# Patient Record
Sex: Male | Born: 1965
Health system: Southern US, Community
[De-identification: ages and names within clinical notes are randomized; demographics above are authoritative.]

## PROBLEM LIST (undated history)

## (undated) DIAGNOSIS — E785 Hyperlipidemia, unspecified: Secondary | ICD-10-CM

## (undated) DIAGNOSIS — I10 Essential (primary) hypertension: Secondary | ICD-10-CM

## (undated) DIAGNOSIS — E119 Type 2 diabetes mellitus without complications: Secondary | ICD-10-CM

---

## 1999-06-08 ENCOUNTER — Emergency Department (HOSPITAL_COMMUNITY): Admission: EM | Admit: 1999-06-08 | Discharge: 1999-06-08 | Payer: Self-pay | Admitting: Emergency Medicine

## 2000-01-24 ENCOUNTER — Emergency Department (HOSPITAL_COMMUNITY): Admission: EM | Admit: 2000-01-24 | Discharge: 2000-01-24 | Payer: Self-pay | Admitting: Emergency Medicine

## 2000-01-24 ENCOUNTER — Encounter: Payer: Self-pay | Admitting: Emergency Medicine

## 2003-04-13 ENCOUNTER — Emergency Department (HOSPITAL_COMMUNITY): Admission: EM | Admit: 2003-04-13 | Discharge: 2003-04-13 | Payer: Self-pay | Admitting: Emergency Medicine

## 2004-02-17 ENCOUNTER — Ambulatory Visit (HOSPITAL_COMMUNITY): Admission: RE | Admit: 2004-02-17 | Discharge: 2004-02-17 | Payer: Self-pay | Admitting: Pulmonary Disease

## 2004-02-18 ENCOUNTER — Ambulatory Visit (HOSPITAL_COMMUNITY): Admission: RE | Admit: 2004-02-18 | Discharge: 2004-02-18 | Payer: Self-pay | Admitting: Pulmonary Disease

## 2005-08-16 ENCOUNTER — Emergency Department (HOSPITAL_COMMUNITY): Admission: EM | Admit: 2005-08-16 | Discharge: 2005-08-16 | Payer: Self-pay | Admitting: Emergency Medicine

## 2006-07-18 ENCOUNTER — Emergency Department (HOSPITAL_COMMUNITY): Admission: EM | Admit: 2006-07-18 | Discharge: 2006-07-18 | Payer: Self-pay | Admitting: Emergency Medicine

## 2009-10-07 ENCOUNTER — Emergency Department (HOSPITAL_COMMUNITY): Admission: EM | Admit: 2009-10-07 | Discharge: 2009-10-07 | Payer: Self-pay | Admitting: Emergency Medicine

## 2009-10-08 ENCOUNTER — Encounter: Payer: Self-pay | Admitting: Pulmonary Disease

## 2009-10-08 ENCOUNTER — Emergency Department (HOSPITAL_COMMUNITY): Admission: EM | Admit: 2009-10-08 | Discharge: 2009-10-08 | Payer: Self-pay | Admitting: Emergency Medicine

## 2009-10-23 ENCOUNTER — Ambulatory Visit (HOSPITAL_COMMUNITY): Admission: RE | Admit: 2009-10-23 | Discharge: 2009-10-23 | Payer: Self-pay | Admitting: Pulmonary Disease

## 2009-10-24 ENCOUNTER — Ambulatory Visit: Payer: Self-pay | Admitting: Pulmonary Disease

## 2009-10-24 DIAGNOSIS — E785 Hyperlipidemia, unspecified: Secondary | ICD-10-CM | POA: Insufficient documentation

## 2009-10-24 DIAGNOSIS — J189 Pneumonia, unspecified organism: Secondary | ICD-10-CM | POA: Insufficient documentation

## 2009-10-24 DIAGNOSIS — R930 Abnormal findings on diagnostic imaging of skull and head, not elsewhere classified: Secondary | ICD-10-CM | POA: Insufficient documentation

## 2009-10-24 DIAGNOSIS — I10 Essential (primary) hypertension: Secondary | ICD-10-CM | POA: Insufficient documentation

## 2009-11-14 ENCOUNTER — Encounter: Admission: RE | Admit: 2009-11-14 | Discharge: 2009-11-14 | Payer: Self-pay | Admitting: Pulmonary Disease

## 2010-01-05 ENCOUNTER — Emergency Department (HOSPITAL_COMMUNITY): Admission: EM | Admit: 2010-01-05 | Discharge: 2010-01-05 | Payer: Self-pay | Admitting: Emergency Medicine

## 2010-02-14 ENCOUNTER — Emergency Department (HOSPITAL_COMMUNITY): Admission: EM | Admit: 2010-02-14 | Discharge: 2010-02-14 | Payer: Self-pay | Admitting: Emergency Medicine

## 2010-02-19 ENCOUNTER — Encounter: Payer: Self-pay | Admitting: Pulmonary Disease

## 2010-02-23 ENCOUNTER — Telehealth: Payer: Self-pay | Admitting: Pulmonary Disease

## 2010-04-02 ENCOUNTER — Telehealth: Payer: Self-pay | Admitting: Pulmonary Disease

## 2010-06-04 ENCOUNTER — Emergency Department (HOSPITAL_COMMUNITY): Admission: EM | Admit: 2010-06-04 | Discharge: 2010-06-04 | Payer: Self-pay | Admitting: Emergency Medicine

## 2010-06-27 ENCOUNTER — Emergency Department (HOSPITAL_COMMUNITY): Admission: EM | Admit: 2010-06-27 | Discharge: 2010-06-27 | Payer: Self-pay | Admitting: Emergency Medicine

## 2010-09-01 NOTE — Progress Notes (Signed)
Summary: nos appt  Phone Note Call from Patient   Caller: [email or web address]  Call For: clance Summary of Call: Rsc nos from 7/22 to 8/5 @ 11:30a. Initial call taken by: Darletta Moll,  February 23, 2010 10:10 AM

## 2010-09-01 NOTE — Miscellaneous (Signed)
Summary: Orders Update  Clinical Lists Changes  Orders: Added new Test order of T-2 View CXR (71020TC) - Signed 

## 2010-09-01 NOTE — Assessment & Plan Note (Signed)
Summary: consult for abnormal ct chest    Copy to:  Dr. Corine Shelter Primary Provider/Referring Provider:  Audley Hose  CC:  Pulmonary Consult for SOB and abnormal CT chest..  History of Present Illness: The pt is a 45y/o male who I have been asked to see for dyspnea.  The pt states that he developed worsening sob but no congestion or purulence about 2 weeks ago.  This was associated with malaise and left chest pain.  He presented to the ER where he had no elevation of his white count, but his d-dimer was elevated.  Because of his atypical chest pain, a ct chest was done to r/o PE.  He was found to have nodules in the RUL and LUL, bihilar LN, no mediastinal LN, a very small left pleural effusion with associated LLL airspace disease (atx vs pna) with elevation of the left hemidiaphragm.  The pt was treated as if this was a pna with doxycycline for 10 days, and does feel much better.  Currently, he denies any cough, congestion, mucus, or further chest pain.  He is eating well, and starting to regain weight.  He denies any h/o TB exposure, and his ppd is negative at 48hrs today.  He has not smoked in 2 weeks.  He denies any h/o cancer, has had a negative colonoscopy in the past, but has never had a prostate exam or psa.  Preventive Screening-Counseling & Management  Alcohol-Tobacco     Smoking Status: current  Current Medications (verified): 1)  Benicar Hct 20-12.5 Mg Tabs (Olmesartan Medoxomil-Hctz) .... Once Daily 2)  Amlodipine Besylate 10 Mg Tabs (Amlodipine Besylate) .Marland Kitchen.. 1 Once Daily 3)  Proair Hfa 108 (90 Base) Mcg/act Aers (Albuterol Sulfate) .... 2 Puffs Every 4-6 Hours As Needed  Allergies (verified): No Known Drug Allergies  Past History:  Past Medical History:  HYPERLIPIDEMIA (ICD-272.4) HYPERTENSION (ICD-401.9)  Past Surgical History: none per pt  Family History: Reviewed history and no changes required. non contributory  Social History: Reviewed history  and no changes required. Patient is a current smoker.  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  The patient denies shortness of breath with activity, productive cough, non-productive cough, coughing up blood, chest pain, irregular heartbeats, acid heartburn, indigestion, loss of appetite, abdominal pain, difficulty swallowing, sore throat, tooth/dental problems, headaches, nasal congestion/difficulty breathing through nose, sneezing, itching, ear ache, anxiety, depression, joint stiffness or pain, rash, change in color of mucus, and fever.    Vital Signs:  Patient profile:   46 year old male Height:      73 inches Weight:      247.50 pounds BMI:     32.77 O2 Sat:      96 % on Room air Temp:     97.9 degrees F oral Pulse rate:   100 / minute BP sitting:   112 / 64  (right arm) Cuff size:   large  Vitals Entered By: Gweneth Dimitri RN (October 24, 2009 9:20 AM)  O2 Flow:  Room air CC: Pulmonary Consult for SOB and abnormal CT chest. Comments Medications reviewed with patient Daytime contact number verified with patient. Gweneth Dimitri RN  October 24, 2009 9:21 AM    Physical Exam  General:  obese male in nad Eyes:  PERRLA and EOMI.   Nose:  deviated septum to right with obstruction, turbinate hypertrophy Mouth:  elongation of soft palate and uvula Neck:  no jvd, tmg, LN Lungs:  crackles deep in left base, o/w clear Heart:  rrr, no mrg Abdomen:  soft and nontender, bs+ Extremities:  no edema or cyanosis pulses intact distally Neurologic:  alert and oriented, moves all 4.   Impression & Recommendations:  Problem # 1:  CT, CHEST, ABNORMAL (ICD-793.1) the pt had recent clinical history and ct findings that suggest pna.  With his alcohol history, I wonder if this was aspiration pna.   He is much improved after a course of abx, and no further pleuritic chest pain.  It is unclear if his upper lobe nodules represent reactive intrapulmonary LN, or whether he may have superimposed sarcoid given his bihilar LN.  At this point, he is improved, and I would like to do a f/u ct in a few weeks to see if everything has resolved.  The pt is agreeable to this approach.  Medications Added to Medication List This Visit: 1)  Benicar Hct 20-12.5 Mg Tabs (Olmesartan medoxomil-hctz) .... Once daily  Other Orders: Consultation Level IV (45409) Radiology Referral (Radiology)  Patient Instructions: 1)  will do followup ct chest in 3 weeks to see if everything has gotten better.  If still present, will need further investigation. 2)  stop smoking.

## 2010-09-01 NOTE — Progress Notes (Signed)
Summary: nos appt  Phone Note Call from Patient   Caller: [email or web address]  Call For: clance Summary of Call: LMTCB x2 to rsc nos from 8/31. Initial call taken by: Darletta Moll,  April 02, 2010 1:54 PM

## 2010-10-13 LAB — POCT I-STAT, CHEM 8
Calcium, Ion: 1.12 mmol/L (ref 1.12–1.32)
Creatinine, Ser: 1.1 mg/dL (ref 0.4–1.5)
HCT: 45 % (ref 39.0–52.0)
Potassium: 3.8 mEq/L (ref 3.5–5.1)

## 2010-10-19 LAB — URINALYSIS, ROUTINE W REFLEX MICROSCOPIC
Ketones, ur: NEGATIVE mg/dL
Urobilinogen, UA: 0.2 mg/dL (ref 0.0–1.0)

## 2010-10-26 LAB — CBC
MCHC: 32.5 g/dL (ref 30.0–36.0)
MCV: 91.7 fL (ref 78.0–100.0)
RDW: 13.7 % (ref 11.5–15.5)

## 2010-10-26 LAB — POCT I-STAT, CHEM 8
Chloride: 101 mEq/L (ref 96–112)
Glucose, Bld: 216 mg/dL — ABNORMAL HIGH (ref 70–99)
HCT: 42 % (ref 39.0–52.0)
Hemoglobin: 14.3 g/dL (ref 13.0–17.0)
TCO2: 26 mmol/L (ref 0–100)

## 2010-10-27 IMAGING — CT CT CHEST W/ CM
1 series · 1 of 2 positions shown · IV contrast (agent unspecified)
Comparison: 10/08/2009

CLINICAL DATA: Lung nodules and enlarged lymph nodes.

CT CHEST WITH CONTRAST
TECHNIQUE: Multidetector CT imaging of the chest was performed
following the standard protocol during bolus administration of
intravenous contrast.
Contrast: 75 ml [id number]

[Series 1: scout · coronal · 380.2mm · 0.57mm/px · 1 of 2 slices shown]
[im 2/2]
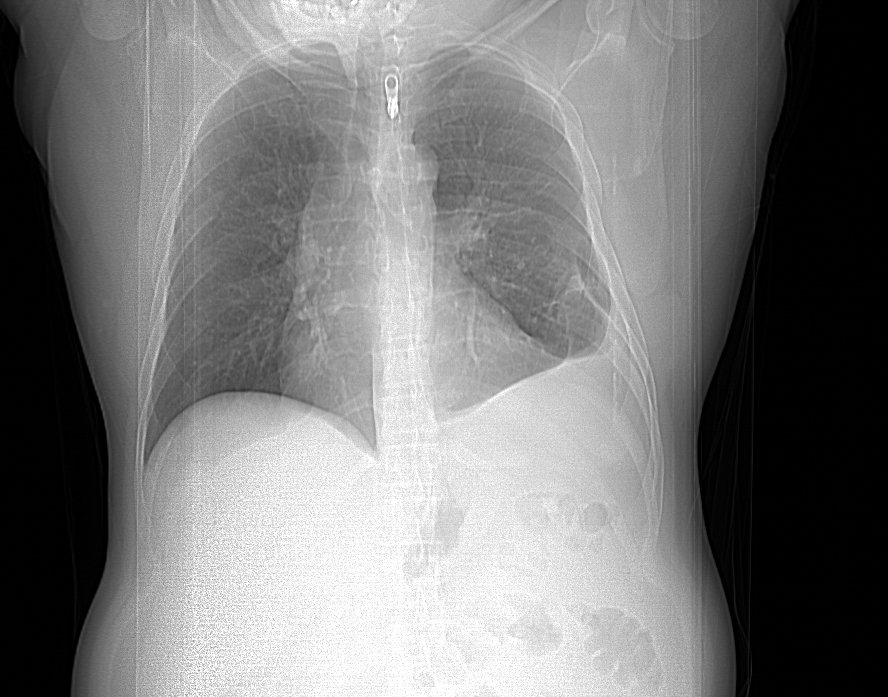

[1 of 2 positions shown; findings below may reference images not displayed]

FINDINGS: The right paratracheal node has a short axis diameter of
0.9 cm (formerly 6 mm).  A right hilar lymph node has a short axis
diameter of 0.8 cm (formerly 11 mm).  A left hilar lymph node has a
short axis diameter 0.9 cm (formerly 1.3 cm).

A small left pleural effusion is present with associated
atelectasis.

The nodular density in the left upper lobe has mostly resolved,
with only faint ground-glass opacity in this vicinity currently,
and measuring 0.7 cm as compares to 1.2 cm.  Thus I believe that
this nodule was previously inflammatory.

The opacities in the left lower lobe and lingula now having a more
band-like appearance, more characteristic of atelectasis than
pneumonia.  There is some faint ground-glass opacity in the
periphery of the right lower lobe on image 33 of series 3 measuring
7 mm in diameter and previously obscured by atelectasis.

There was previously a pleural-based dense nodule in the right
upper lobe measuring approximately 1.8 x 1.7 cm.  Currently this
lesion is fully intraparenchymal, has a less dense ground-glass
opacity, and measures 0.9 x 0.9 cm.  The significant improvement is
also highly likely to represent a benign inflammatory etiology.

Mild airway thickening is present.

Peripherally in the right middle lobe, the 0.6 x 0.5 cm nodule is
present.  This previously measured 0.8 x 0.6 cm.  A smaller 4 mm
nodule along the minor fissure is stable on image 34 of series 3.

Underlying emphysema is noted.
IMPRESSION: 1.  Significantly reduced adenopathy.
2.  Significant reduction in size and density of the more worrisome
upper lobe pulmonary nodules, indicating that these nodules were
likely inflammatory.
3.  There is some persistent scarring or atelectasis in the lingula
and left lower lobe, along with a small left pleural effusion.

4.  There are two small nodules within the right middle lobe, one
of which measures 5 mm in diameter and has decreased in size, and
the other of which measures 4 mm in diameter and is stable.  If the
patient is at high risk for bronchogenic carcinoma, follow-up chest
CT at 6-12 months is recommended.  If the patient is at low risk
for bronchogenic carcinoma, follow-up chest CT at 12 months is
recommended.  This recommendation follows the consensus statement:
Guidelines for Management of Small Pulmonary Nodules Detected on CT
Scans: A Statement from the [HOSPITAL] as published in
[URL]
5.  Emphysema.

## 2012-01-18 ENCOUNTER — Telehealth: Payer: Self-pay | Admitting: Pulmonary Disease

## 2012-01-18 NOTE — Telephone Encounter (Signed)
Documentation opened in error.

## 2012-02-01 ENCOUNTER — Ambulatory Visit (HOSPITAL_COMMUNITY): Admission: RE | Admit: 2012-02-01 | Payer: Self-pay | Source: Ambulatory Visit

## 2013-01-25 ENCOUNTER — Emergency Department (HOSPITAL_COMMUNITY)
Admission: EM | Admit: 2013-01-25 | Discharge: 2013-01-25 | Disposition: A | Discharge status: Home / Self Care | Payer: BC Managed Care – PPO | Attending: Emergency Medicine | Admitting: Emergency Medicine

## 2013-01-25 ENCOUNTER — Encounter (HOSPITAL_COMMUNITY): Payer: Self-pay | Admitting: Emergency Medicine

## 2013-01-25 ENCOUNTER — Emergency Department (HOSPITAL_COMMUNITY): Payer: BC Managed Care – PPO

## 2013-01-25 DIAGNOSIS — M549 Dorsalgia, unspecified: Secondary | ICD-10-CM

## 2013-01-25 DIAGNOSIS — M545 Low back pain, unspecified: Secondary | ICD-10-CM | POA: Insufficient documentation

## 2013-01-25 DIAGNOSIS — E119 Type 2 diabetes mellitus without complications: Secondary | ICD-10-CM | POA: Insufficient documentation

## 2013-01-25 DIAGNOSIS — Z87891 Personal history of nicotine dependence: Secondary | ICD-10-CM | POA: Insufficient documentation

## 2013-01-25 DIAGNOSIS — I10 Essential (primary) hypertension: Secondary | ICD-10-CM | POA: Insufficient documentation

## 2013-01-25 DIAGNOSIS — E785 Hyperlipidemia, unspecified: Secondary | ICD-10-CM | POA: Insufficient documentation

## 2013-01-25 DIAGNOSIS — Z79899 Other long term (current) drug therapy: Secondary | ICD-10-CM | POA: Insufficient documentation

## 2013-01-25 HISTORY — DX: Hyperlipidemia, unspecified: E78.5

## 2013-01-25 HISTORY — DX: Essential (primary) hypertension: I10

## 2013-01-25 HISTORY — DX: Type 2 diabetes mellitus without complications: E11.9

## 2013-01-25 LAB — CBC WITH DIFFERENTIAL/PLATELET
Basophils Relative: 0 % (ref 0–1)
Eosinophils Relative: 3 % (ref 0–5)
Hemoglobin: 13.3 g/dL (ref 13.0–17.0)
Monocytes Relative: 7 % (ref 3–12)
Neutro Abs: 4.1 10*3/uL (ref 1.7–7.7)
RDW: 14.4 % (ref 11.5–15.5)
WBC: 7.2 10*3/uL (ref 4.0–10.5)

## 2013-01-25 LAB — URINALYSIS, ROUTINE W REFLEX MICROSCOPIC
Bilirubin Urine: NEGATIVE
Glucose, UA: NEGATIVE mg/dL
Hgb urine dipstick: NEGATIVE
Specific Gravity, Urine: 1.03 (ref 1.005–1.030)
Urobilinogen, UA: 1 mg/dL (ref 0.0–1.0)

## 2013-01-25 LAB — COMPREHENSIVE METABOLIC PANEL
BUN: 21 mg/dL (ref 6–23)
CO2: 26 mEq/L (ref 19–32)
Chloride: 103 mEq/L (ref 96–112)
Creatinine, Ser: 0.95 mg/dL (ref 0.50–1.35)
GFR calc non Af Amer: >90 mL/min (ref >90)
Potassium: 4 mEq/L (ref 3.5–5.1)
Sodium: 137 mEq/L (ref 135–145)

## 2013-01-25 LAB — LIPASE, BLOOD: Lipase: 36 U/L (ref 11–59)

## 2013-01-25 MED ORDER — HYDROCODONE-ACETAMINOPHEN 5-325 MG PO TABS: 1.0000 | ORAL_TABLET | Freq: Four times a day (QID) | ORAL | Status: DC | PRN

## 2013-01-25 MED ORDER — HYDROCODONE-ACETAMINOPHEN 5-325 MG PO TABS
2.0000 | ORAL_TABLET | Freq: Once | ORAL | Status: AC
  Administered 2013-01-25: 2 via ORAL
  Filled 2013-01-25: qty 2

## 2013-01-25 NOTE — ED Notes (Signed)
Pt presenting to ed with c/o right side flank pain x 2-3 days pt denies nausea, vomiting, hematuria at this time pt denies injury

## 2013-01-25 NOTE — Progress Notes (Signed)
   CARE MANAGEMENT ED NOTE 01/25/2013  Patient:  Craig Yang, Craig Yang   Account Number:  [id number]  Date Initiated:  01/25/2013  Documentation initiated by:  Radford Pax  Subjective/Objective Assessment:   Patient presents to ED with right side flank pain     Subjective/Objective Assessment Detail:     Action/Plan:   Action/Plan Detail:   Anticipated DC Date:       Status Recommendation to Physician:   Result of Recommendation:    Other ED Services  Consult Working Plan    DC Planning Services  Other  PCP issues    Choice offered to / List presented to:            Status of service:  Completed, signed off  ED Comments:   ED Comments Detail:  Patient states that Dr. Petra Kuba in [place] is his pcp.

## 2013-01-25 NOTE — ED Provider Notes (Signed)
History    CSN: [id number] Arrival date & time 01/25/13  1614  First MD Initiated Contact with Patient 01/25/13 1652     Chief Complaint  Patient presents with  . Flank Pain   (Consider location/radiation/quality/duration/timing/severity/associated sxs/prior Treatment) Patient is a 47 y.o. male presenting with flank pain. The history is provided by the patient.  Flank Pain Pertinent negatives include no chest pain, no abdominal pain, no headaches and no shortness of breath.  pt c/o right lower back, right posterior flank pain for the past 3-4 days. Constant. Dull. Moderate-sever. Denies specific exacerbating or alleviating factors. Radiates towards right buttock/hip. No pain down leg. No numbness/weakness. No swelling. No skin changes, redness or rash. No hx same pain. No hx ddd. Denies back injury or strain. 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 No anterior/abd pain. No hematuria or dysuria. No nv. No scrotal or testicular pain.     Past Medical History  Diagnosis Date  . Hyperlipidemia   . Diabetes mellitus without complication   . Hypertension    History reviewed. No pertinent past surgical history. No family history on file. History  Substance Use Topics  . Smoking status: Former Games developer  . Smokeless tobacco: Not on file  . Alcohol Use: 0.6 oz/week    1 Cans of beer per week    Review of Systems  Constitutional: Negative for fever and chills.  HENT: Negative for neck pain.   Eyes: Negative for redness.  Respiratory: Negative for shortness of breath.   Cardiovascular: Negative for chest pain.  Gastrointestinal: Negative for nausea, vomiting, abdominal pain and diarrhea.  Genitourinary: Positive for flank pain.  Musculoskeletal: Positive for back pain.  Skin: Negative for rash.  Neurological: Negative for weakness, numbness and headaches.  Hematological: Does not bruise/bleed easily.  Psychiatric/Behavioral: Negative for confusion.     Allergies  Review of patient's allergies indicates no known allergies.  Home Medications   Current Outpatient Rx  Name  Route  Sig  Dispense  Refill  . ibuprofen (ADVIL,MOTRIN) 200 MG tablet   Oral   Take 400 mg by mouth every 6 (six) hours as needed for pain.         . rosuvastatin (CRESTOR) 10 MG tablet   Oral   Take 10 mg by mouth every morning.          BP 142/88  Pulse 82  Temp(Src) 98.8 F (37.1 C) (Oral)  Resp 16  SpO2 97% Physical Exam  Nursing note and vitals reviewed. Constitutional: He is oriented to person, place, and time. He appears well-developed and well-nourished. No distress.  HENT:  Mouth/Throat: Oropharynx is clear and moist.  Eyes: Conjunctivae are normal. No scleral icterus.  Neck: Neck supple. No tracheal deviation present.  Cardiovascular: Normal rate.   Pulmonary/Chest: Effort normal. No accessory muscle usage. No respiratory distress.  Abdominal: Soft. Bowel sounds are normal. He exhibits no distension and no mass. There is no tenderness. There is no rebound and no guarding.  Genitourinary:  No scrotal or testicular pain or tenderness. No cva tenderness  Musculoskeletal: Normal range of motion. He exhibits no edema and no tenderness.  tls spine non tender, aligned, no step off. Mild lumbar muscular tenderness on right  Neurological: He is alert and oriented to person, place, and time. He displays normal reflexes.  Straight leg raise neg. Steady gait. Motor 5/5.   Skin: Skin is warm and dry. No rash noted.  No shingles/rash to area  of pain.   Psychiatric: He has a normal mood and affect.    ED Course  Procedures (including critical care time)  Results for orders placed during the hospital encounter of 01/25/13  CBC WITH DIFFERENTIAL      Result Value Range   WBC 7.2  4.0 - 10.5 K/uL   RBC 4.84  4.22 - 5.81 MIL/uL   Hemoglobin 13.3  13.0 - 17.0 g/dL   HCT 45.4  09.8 - 11.9 %   MCV 84.5  78.0 - 100.0 fL   MCH 27.5  26.0 - 34.0 pg    MCHC 32.5  30.0 - 36.0 g/dL   RDW 14.7  82.9 - 56.2 %   Platelets 216  150 - 400 K/uL   Neutrophils Relative % 58  43 - 77 %   Neutro Abs 4.1  1.7 - 7.7 K/uL   Lymphocytes Relative 32  12 - 46 %   Lymphs Abs 2.3  0.7 - 4.0 K/uL   Monocytes Relative 7  3 - 12 %   Monocytes Absolute 0.5  0.1 - 1.0 K/uL   Eosinophils Relative 3  0 - 5 %   Eosinophils Absolute 0.2  0.0 - 0.7 K/uL   Basophils Relative 0  0 - 1 %   Basophils Absolute 0.0  0.0 - 0.1 K/uL  COMPREHENSIVE METABOLIC PANEL      Result Value Range   Sodium 137  135 - 145 mEq/L   Potassium 4.0  3.5 - 5.1 mEq/L   Chloride 103  96 - 112 mEq/L   CO2 26  19 - 32 mEq/L   Glucose, Bld 95  70 - 99 mg/dL   BUN 21  6 - 23 mg/dL   Creatinine, Ser 1.30  0.50 - 1.35 mg/dL   Calcium 9.2  8.4 - 86.5 mg/dL   Total Protein 7.8  6.0 - 8.3 g/dL   Albumin 3.7  3.5 - 5.2 g/dL   AST 15  0 - 37 U/L   ALT 17  0 - 53 U/L   Alkaline Phosphatase 53  39 - 117 U/L   Total Bilirubin 0.3  0.3 - 1.2 mg/dL   GFR calc non Af Amer >90  >90 mL/min   GFR calc Af Amer >90  >90 mL/min  LIPASE, BLOOD      Result Value Range   Lipase 36  11 - 59 U/L  URINALYSIS, ROUTINE W REFLEX MICROSCOPIC      Result Value Range   Color, Urine YELLOW  YELLOW   APPearance CLEAR  CLEAR   Specific Gravity, Urine 1.030  1.005 - 1.030   pH 6.0  5.0 - 8.0   Glucose, UA NEGATIVE  NEGATIVE mg/dL   Hgb urine dipstick NEGATIVE  NEGATIVE   Bilirubin Urine NEGATIVE  NEGATIVE   Ketones, ur NEGATIVE  NEGATIVE mg/dL   Protein, ur NEGATIVE  NEGATIVE mg/dL   Urobilinogen, UA 1.0  0.0 - 1.0 mg/dL   Nitrite NEGATIVE  NEGATIVE   Leukocytes, UA NEGATIVE  NEGATIVE   Dg Lumbar Spine Complete  01/25/2013   *RADIOLOGY REPORT*  Clinical Data: Low back pain for 4 days, no trauma  LUMBAR SPINE - COMPLETE 4+ VIEW  Comparison: None  Findings: Five non-rib bearing lumbar vertebrae. Vertebral body and disc space heights maintained. No acute fracture, subluxation or bone destruction. No  spondylolysis. SI joints symmetric. Sclerosis at the femoral heads is questioned particularly at the left femoral head, cannot exclude avascular necrosis.  IMPRESSION: No  acute lumbar spine abnormalities. Questionable femoral head sclerosis particularly on the left, cannot exclude a breast necrosis.   Original Report Authenticated By: Ulyses Southward, M.D.     MDM  No meds pta, pt has ride, did not drive.  Hydrocodone po.  Labs.  Reviewed nursing notes and prior charts for additional history.   Recheck pt comfortable. Reviewed labs and xrays w pt, need for pcp follow up.  Pain improved.  Pt appears stable for d/c.     Suzi Roots, MD 01/25/13 [phone]

## 2014-08-10 ENCOUNTER — Ambulatory Visit (INDEPENDENT_AMBULATORY_CARE_PROVIDER_SITE_OTHER): Payer: BLUE CROSS/BLUE SHIELD | Admitting: Family Medicine

## 2014-08-10 VITALS — BP 122/78 | HR 79 | Temp 98.1°F | Resp 18

## 2014-08-10 DIAGNOSIS — L259 Unspecified contact dermatitis, unspecified cause: Secondary | ICD-10-CM

## 2014-08-10 DIAGNOSIS — L309 Dermatitis, unspecified: Secondary | ICD-10-CM

## 2014-08-10 MED ORDER — TRIAMCINOLONE ACETONIDE 0.1 % EX CREA: 1.0000 "application " | TOPICAL_CREAM | Freq: Two times a day (BID) | CUTANEOUS | Status: DC | PRN

## 2014-08-10 NOTE — Progress Notes (Signed)
Chief Complaint:  Chief Complaint  Patient presents with  . Rash    Arms/faces- pt believes it is due to latex allergy x1 week    HPI: Craig Yang is a 49 y.o. male who is here for : Rash that occurred one week ago when he had used latex glove to detail his car, he has latex allergy He has ahd it for 1 week, it itches , has not tried anything for this. Denies CP, SOB, voice changes, wheezing He has DM and has had  Hypoglycemia. Last HbA1c was 6.4 He is on Janumet he is only taking 1/2 pill and if sugars are higher than 110 then he takes 1/2 pill otherwise he does not take anything.  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   Past Medical History  Diagnosis Date  . Hyperlipidemia   . Diabetes mellitus without complication   . Hypertension    History reviewed. No pertinent past surgical history. History   Social History  . Marital Status: Married    Spouse Name: N/A    Number of Children: N/A  . Years of Education: N/A   Social History Main Topics  . Smoking status: Former Games developermoker  . Smokeless tobacco: None  . Alcohol Use: 0.6 oz/week    1 Cans of beer per week  . Drug Use: No  . Sexual Activity: None   Other Topics Concern  . None   Social History Narrative   Family History  Problem Relation Age of Onset  . Diabetes Father    Allergies  Allergen Reactions  . Latex Rash   Prior to Admission medications   Medication Sig Start Date End Date Taking? Authorizing Provider  Fluticasone-Salmeterol (ADVAIR) 100-50 MCG/DOSE AEPB Inhale 1 puff into the lungs 2 (two) times daily.   Yes Historical Provider, MD  furosemide (LASIX) 40 MG tablet Take 40 mg by mouth.   Yes Historical Provider, MD  olmesartan-hydrochlorothiazide (BENICAR HCT) 40-25 MG per tablet Take 1 tablet by mouth daily.   Yes Historical Provider, MD  rosuvastatin (CRESTOR) 10 MG tablet Take 10 mg by mouth  every morning.   Yes Historical Provider, MD  SitaGLIPtin-MetFORMIN HCl (JANUMET XR) [phone] MG TB24 Take by mouth.   Yes Historical Provider, MD  HYDROcodone-acetaminophen (NORCO/VICODIN) 5-325 MG per tablet Take 1-2 tablets by mouth every 6 (six) hours as needed for pain. Patient not taking: Reported on 08/10/2014 01/25/13   Suzi RootsKevin E Steinl, MD  ibuprofen (ADVIL,MOTRIN) 200 MG tablet Take 400 mg by mouth every 6 (six) hours as needed for pain.    Historical Provider, MD     ROS: The patient denies fevers, chills, night sweats, unintentional weight loss, chest pain, palpitations, wheezing, dyspnea on exertion, nausea, vomiting, abdominal pain, dysuria, hematuria, melena, numbness, weakness, or tingling.  All other systems have been reviewed and were otherwise negative with the exception of those mentioned in the HPI and as above.    PHYSICAL EXAM: Filed Vitals:   08/10/14 0920  BP: 122/78  Pulse: 79  Temp: 98.1 F (36.7 C)  Resp: 18   There were no vitals filed for this visit. There is no weight on file to calculate BMI.  General: Alert, no acute distress HEENT:  Normocephalic, atraumatic, oropharynx patent. EOMI, PERRLA Cardiovascular:  Regular rate and rhythm, no rubs murmurs or gallops.  No Carotid bruits, radial  pulse intact. No pedal edema.  Respiratory: Clear to auscultation bilaterally.  No wheezes, rales, or rhonchi.  No cyanosis, no use of accessory musculature GI: No organomegaly, abdomen is soft and non-tender, positive bowel sounds.  No masses. Skin: + contact derm rash on arms, face Neurologic: Facial musculature symmetric. Psychiatric: Patient is appropriate throughout our interaction. Lymphatic: No cervical lymphadenopathy Musculoskeletal: Gait intact.   LABS: Results for orders placed or performed during the hospital encounter of 01/25/13  CBC with Differential  Result Value Ref Range   WBC 7.2 4.0 - 10.5 K/uL   RBC 4.84 4.22 - 5.81 MIL/uL   Hemoglobin 13.3  13.0 - 17.0 g/dL   HCT 11.9 14.7 - 82.9 %   MCV 84.5 78.0 - 100.0 fL   MCH 27.5 26.0 - 34.0 pg   MCHC 32.5 30.0 - 36.0 g/dL   RDW 56.2 13.0 - 86.5 %   Platelets 216 150 - 400 K/uL   Neutrophils Relative % 58 43 - 77 %   Neutro Abs 4.1 1.7 - 7.7 K/uL   Lymphocytes Relative 32 12 - 46 %   Lymphs Abs 2.3 0.7 - 4.0 K/uL   Monocytes Relative 7 3 - 12 %   Monocytes Absolute 0.5 0.1 - 1.0 K/uL   Eosinophils Relative 3 0 - 5 %   Eosinophils Absolute 0.2 0.0 - 0.7 K/uL   Basophils Relative 0 0 - 1 %   Basophils Absolute 0.0 0.0 - 0.1 K/uL  Comprehensive metabolic panel  Result Value Ref Range   Sodium 137 135 - 145 mEq/L   Potassium 4.0 3.5 - 5.1 mEq/L   Chloride 103 96 - 112 mEq/L   CO2 26 19 - 32 mEq/L   Glucose, Bld 95 70 - 99 mg/dL   BUN 21 6 - 23 mg/dL   Creatinine, Ser 7.84 0.50 - 1.35 mg/dL   Calcium 9.2 8.4 - 69.6 mg/dL   Total Protein 7.8 6.0 - 8.3 g/dL   Albumin 3.7 3.5 - 5.2 g/dL   AST 15 0 - 37 U/L   ALT 17 0 - 53 U/L   Alkaline Phosphatase 53 39 - 117 U/L   Total Bilirubin 0.3 0.3 - 1.2 mg/dL   GFR calc non Af Amer >90 >90 mL/min   GFR calc Af Amer >90 >90 mL/min  Lipase, blood  Result Value Ref Range   Lipase 36 11 - 59 U/L  Urinalysis, Routine w reflex microscopic  Result Value Ref Range   Color, Urine YELLOW YELLOW   APPearance CLEAR CLEAR   Specific Gravity, Urine 1.030 1.005 - 1.030   pH 6.0 5.0 - 8.0   Glucose, UA NEGATIVE NEGATIVE mg/dL   Hgb urine dipstick NEGATIVE NEGATIVE   Bilirubin Urine NEGATIVE NEGATIVE   Ketones, ur NEGATIVE NEGATIVE mg/dL   Protein, ur NEGATIVE NEGATIVE mg/dL   Urobilinogen, UA 1.0 0.0 - 1.0 mg/dL   Nitrite NEGATIVE NEGATIVE   Leukocytes, UA NEGATIVE NEGATIVE     EKG/XRAY:   Primary read interpreted by Dr. Conley Rolls at [facility].   ASSESSMENT/PLAN: Encounter Diagnosis  Name Primary?  . Contact dermatitis Yes    Allergic to latex He declines steroids at thsi time due to his DM and he doe snot want IM Benadryl since he wants to  go to [employer] without being drowsy He is going to start off with Benadryl and also Higher does steroid cream  Rx Benadryl 50 mg x 1 dose , then 25 mg every 8 hours until itching  is releived, also mometasone cream TID prn for itching  Gross sideeffects, risk and benefits, and alternatives of medications d/w patient. Patient is aware that all medications have potential sideeffects and we are unable to predict every sideeffect or drug-drug interaction that may occur.  LE, THAO PHUONG, DO 08/10/2014 9:46 AM

## 2014-09-16 ENCOUNTER — Ambulatory Visit: Payer: BLUE CROSS/BLUE SHIELD | Admitting: Podiatry

## 2014-09-23 ENCOUNTER — Ambulatory Visit (INDEPENDENT_AMBULATORY_CARE_PROVIDER_SITE_OTHER): Payer: BLUE CROSS/BLUE SHIELD | Admitting: Podiatry

## 2014-09-23 ENCOUNTER — Encounter: Payer: Self-pay | Admitting: Podiatry

## 2014-09-23 VITALS — Ht 73.0 in | Wt 245.0 lb

## 2014-09-23 DIAGNOSIS — M722 Plantar fascial fibromatosis: Secondary | ICD-10-CM

## 2014-09-23 DIAGNOSIS — B351 Tinea unguium: Secondary | ICD-10-CM

## 2014-09-23 DIAGNOSIS — E119 Type 2 diabetes mellitus without complications: Secondary | ICD-10-CM

## 2014-09-23 NOTE — Progress Notes (Signed)
   Subjective:    Patient ID: Craig Yang, male    DOB: 05/03/1966, 49 y.o.   MRN: [id number]  HPI Comments: N debridement, and diabetic foot exam L 10 toenails and left 1st plantar MPJ D and O long-term C elongated, thickened toenails and painful callous A diabetic, and difficulty trimming T Dr. Petra KubaKilpatrick referred.    Diabetes   Patient also complains of diffuse pain upon weight-bearing on the heels and forefoot area in the right and left feet. He describes shoe changes without any relief of symptoms. 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      Objective:   Physical Exam  Orientated 3  Vascular: DP pulses 2/4 bilaterally PT pulses 2/4 bilaterally Capillary reflex immediate bilaterally  Neurological: Sensation to 10 g monofilament wire intact 5/5 bilaterally Vibratory sensation intact bilaterally Ankle reflex equal and reactive bilaterally  Dermatological: The toenails are elongated, incurvated, discolored 6-10 Scaling plantar keratoses plantar left first MPJ  Musculoskeletal: Pes planus bilaterally Hammertoe deformity second bilaterally Palpable tenderness medial plantar right heel and mild tenderness left heel without any palpable lesions Palpable tenderness plantar second to fourth MPJ right without any palpable lesions There is no crepitus or tenderness on range of motion MPJ one through 5 bilaterally      Assessment & Plan:   Assessment: Satisfactory neurovascular status Mycotic toenails 6-10 Plantar fasciitis bilaterally Metatarsalgia bilaterally Pes planus bilaterally Hammertoe deformity second bilaterally  Plan: Debrided toenails 6-10 without a bleeding Recommend custom foot orthotics for the indication of bilateral fasciitis and metatarsalgia. I made patient aware that my expectations were reduction of some of his foot pain but not  complete eradication. Also, made patient aware that he shoes size with needed change with custom foot orthotics. Patient would like custom foot orthotics  Digital scan obtained today for custom foot orthotics [employer] Full length Vinyl top cover  Notify patient upon receipt of custom foot orthotics  Reappoint 3 months for debridement of mycotic toenails

## 2014-09-23 NOTE — Patient Instructions (Addendum)
Our office will contact you when you're custom foot orthotics arrive Do not purchase any work shoes until we dispense  custom foot orthotics  Diabetes and Foot Care Diabetes may cause you to have problems because of poor blood supply (circulation) to your feet and legs. This may cause the skin on your feet to become thinner, break easier, and heal more slowly. Your skin may become dry, and the skin may peel and crack. You may also have nerve damage in your legs and feet causing decreased feeling in them. You may not notice minor injuries to your feet that could lead to infections or more serious problems. Taking care of your feet is one of the most important things you can do for yourself.  HOME CARE INSTRUCTIONS  Wear shoes at all times, even in the house. Do not go barefoot. Bare feet are easily injured.  Check your feet daily for blisters, cuts, and redness. If you cannot see the bottom of your feet, use a mirror or ask someone for help.  Wash your feet with warm water (do not use hot water) and mild soap. Then pat your feet and the areas between your toes until they are completely dry. Do not soak your feet as this can dry your skin.  Apply a moisturizing lotion or petroleum jelly (that does not contain alcohol and is unscented) to the skin on your feet and to dry, brittle toenails. Do not apply lotion between your toes.  Trim your toenails straight across. Do not dig under them or around the cuticle. File the edges of your nails with an emery board or nail file.  Do not cut corns or calluses or try to remove them with medicine.  Wear clean socks or stockings every day. Make sure they are not too tight. Do not wear knee-high stockings since they may decrease blood flow to your legs.  Wear shoes that fit properly and have enough cushioning. To break in new shoes, wear them for just a few hours a day. This prevents you from injuring your feet. Always look in your shoes before you put them on  to be sure there are no objects inside.  Do not cross your legs. This may decrease the blood flow to your feet.  If you find a minor scrape, cut, or break in the skin on your feet, keep it and the skin around it clean and dry. These areas may be cleansed with mild soap and water. Do not cleanse the area with peroxide, alcohol, or iodine.  When you remove an adhesive bandage, be sure not to damage the skin around it.  If you have a wound, look at it several times a day to make sure it is healing.  Do not use heating pads or hot water bottles. They may burn your skin. If you have lost feeling in your feet or legs, you may not know it is happening until it is too late.  Make sure your health care provider performs a complete foot exam at least annually or more often if you have foot problems. Report any cuts, sores, or bruises to your health care provider immediately. SEEK MEDICAL CARE IF:   You have an injury that is not healing.  You have cuts or breaks in the skin.  You have an ingrown nail.  You notice redness on your legs or feet.  You feel burning or tingling in your legs or feet.  You have pain or cramps in your legs and  feet.  Your legs or feet are numb.  Your feet always feel cold. SEEK IMMEDIATE MEDICAL CARE IF:   There is increasing redness, swelling, or pain in or around a wound.  There is a red line that goes up your leg.  Pus is coming from a wound.  You develop a fever or as directed by your health care provider.  You notice a bad smell coming from an ulcer or wound. Document Released: 07/16/2000 Document Revised: 03/21/2013 Document Reviewed: 12/26/2012 [facility] Patient Information 2015 [place], [place]. This information is not intended to replace advice given to you by your health care provider. Make sure you discuss any questions you have with your health care provider.

## 2014-10-14 ENCOUNTER — Encounter: Payer: Self-pay | Admitting: Podiatry

## 2014-10-14 ENCOUNTER — Ambulatory Visit (INDEPENDENT_AMBULATORY_CARE_PROVIDER_SITE_OTHER): Payer: BLUE CROSS/BLUE SHIELD | Admitting: Podiatry

## 2014-10-14 VITALS — BP 130/80 | HR 96 | Resp 12

## 2014-10-14 DIAGNOSIS — M722 Plantar fascial fibromatosis: Secondary | ICD-10-CM

## 2014-10-14 NOTE — Progress Notes (Signed)
   Subjective:    Patient ID: Craig Yang, male    DOB: 03/01/1966, 49 y.o.   MRN: [id number]  HPI   Patient presents today for dispensing of custom foot orthotics with a history of plantar fasciitis  Review of Systems     Objective:   Physical Exam  Custom foot orthotics contour satisfactorily      Assessment & Plan:   Assessment: Satisfactory fit of custom orthotics Plantar fasciitis  Plan: Orthotics dispensed with wearing instruction provided  Reappoint 6 weeks

## 2014-10-14 NOTE — Patient Instructions (Signed)

## 2014-12-09 ENCOUNTER — Ambulatory Visit (INDEPENDENT_AMBULATORY_CARE_PROVIDER_SITE_OTHER): Payer: BLUE CROSS/BLUE SHIELD | Admitting: Podiatry

## 2014-12-09 ENCOUNTER — Encounter: Payer: Self-pay | Admitting: Podiatry

## 2014-12-09 DIAGNOSIS — M79676 Pain in unspecified toe(s): Secondary | ICD-10-CM

## 2014-12-09 DIAGNOSIS — B351 Tinea unguium: Secondary | ICD-10-CM | POA: Diagnosis not present

## 2014-12-09 NOTE — Patient Instructions (Signed)
Diabetes and Foot Care Diabetes may cause you to have problems because of poor blood supply (circulation) to your feet and legs. This may cause the skin on your feet to become thinner, break easier, and heal more slowly. Your skin may become dry, and the skin may peel and crack. You may also have nerve damage in your legs and feet causing decreased feeling in them. You may not notice minor injuries to your feet that could lead to infections or more serious problems. Taking care of your feet is one of the most important things you can do for yourself.  HOME CARE INSTRUCTIONS  Wear shoes at all times, even in the house. Do not go barefoot. Bare feet are easily injured.  Check your feet daily for blisters, cuts, and redness. If you cannot see the bottom of your feet, use a mirror or ask someone for help.  Wash your feet with warm water (do not use hot water) and mild soap. Then pat your feet and the areas between your toes until they are completely dry. Do not soak your feet as this can dry your skin.  Apply a moisturizing lotion or petroleum jelly (that does not contain alcohol and is unscented) to the skin on your feet and to dry, brittle toenails. Do not apply lotion between your toes.  Trim your toenails straight across. Do not dig under them or around the cuticle. File the edges of your nails with an emery board or nail file.  Do not cut corns or calluses or try to remove them with medicine.  Wear clean socks or stockings every day. Make sure they are not too tight. Do not wear knee-high stockings since they may decrease blood flow to your legs.  Wear shoes that fit properly and have enough cushioning. To break in new shoes, wear them for just a few hours a day. This prevents you from injuring your feet. Always look in your shoes before you put them on to be sure there are no objects inside.  Do not cross your legs. This may decrease the blood flow to your feet.  If you find a minor scrape,  cut, or break in the skin on your feet, keep it and the skin around it clean and dry. These areas may be cleansed with mild soap and water. Do not cleanse the area with peroxide, alcohol, or iodine.  When you remove an adhesive bandage, be sure not to damage the skin around it.  If you have a wound, look at it several times a day to make sure it is healing.  Do not use heating pads or hot water bottles. They may burn your skin. If you have lost feeling in your feet or legs, you may not know it is happening until it is too late.  Make sure your health care provider performs a complete foot exam at least annually or more often if you have foot problems. Report any cuts, sores, or bruises to your health care provider immediately. SEEK MEDICAL CARE IF:   You have an injury that is not healing.  You have cuts or breaks in the skin.  You have an ingrown nail.  You notice redness on your legs or feet.  You feel burning or tingling in your legs or feet.  You have pain or cramps in your legs and feet.  Your legs or feet are numb.  Your feet always feel cold. SEEK IMMEDIATE MEDICAL CARE IF:   There is increasing redness,   swelling, or pain in or around a wound.  There is a red line that goes up your leg.  Pus is coming from a wound.  You develop a fever or as directed by your health care provider.  You notice a bad smell coming from an ulcer or wound. Document Released: 07/16/2000 Document Revised: 03/21/2013 Document Reviewed: 12/26/2012 [employer] Patient Information 2015 [employer]. This information is not intended to replace advice given to you by your health care provider. Make sure you discuss any questions you have with your health care provider.  

## 2014-12-10 NOTE — Progress Notes (Signed)
Patient ID: Craig Yang, male   DOB: 02/27/1966, 49 y.o.   MRN: [id number]  Subjective: This patient presents today complaining of painful toenails and requests nail debridement. He also states that the custom foot orthotics are comfortable and his plantar fasciitis has resolved  Objective: The toenails are elongated, brittle, discolored, hypertrophic and tender to direct palpation 6-10  Assessment: Symptomatic onychomycoses 6-10 Resolve plantar fasciitis  Plan: Debridement of toenails 10 without any bleeding Continue wearing custom orthotics  Reappoint 3 months for nail debridement

## 2015-03-10 ENCOUNTER — Ambulatory Visit: Payer: BLUE CROSS/BLUE SHIELD | Admitting: Podiatry

## 2015-03-12 ENCOUNTER — Ambulatory Visit: Payer: BLUE CROSS/BLUE SHIELD | Admitting: Podiatry

## 2015-04-09 ENCOUNTER — Ambulatory Visit: Payer: BLUE CROSS/BLUE SHIELD | Admitting: Podiatry

## 2015-07-01 ENCOUNTER — Ambulatory Visit (INDEPENDENT_AMBULATORY_CARE_PROVIDER_SITE_OTHER): Payer: BLUE CROSS/BLUE SHIELD | Admitting: Family Medicine

## 2015-07-01 VITALS — BP 132/80 | HR 83 | Temp 98.5°F | Resp 16 | Ht 73.0 in | Wt 259.2 lb

## 2015-07-01 DIAGNOSIS — T7840XA Allergy, unspecified, initial encounter: Secondary | ICD-10-CM | POA: Diagnosis not present

## 2015-07-01 DIAGNOSIS — E119 Type 2 diabetes mellitus without complications: Secondary | ICD-10-CM | POA: Diagnosis not present

## 2015-07-01 DIAGNOSIS — R21 Rash and other nonspecific skin eruption: Secondary | ICD-10-CM

## 2015-07-01 DIAGNOSIS — I10 Essential (primary) hypertension: Secondary | ICD-10-CM

## 2015-07-01 MED ORDER — PREDNISONE 20 MG PO TABS: 20.0000 mg | ORAL_TABLET | Freq: Every day | ORAL | Status: DC

## 2015-07-01 NOTE — Patient Instructions (Signed)
Take the prednisone 20 mg 3 pills daily for 2 days, then 2 daily for 2 days, then 1 daily for 2 days  If your blood sugar gets greater than 200 fasting hold the medication  Take over-the-counter Zyrtec (cetirizine) one daily  Use over-the-counter 1% hydrocortisone cream on the rash twice daily for 4 days only.  In the event of any acute worsening of the allergic reaction such as breathing difficulty or swelling of the mouth go to the emergency room.  Return as needed

## 2015-07-01 NOTE — Progress Notes (Signed)
Patient ID: Craig Yang, male    DOB: 10/03/1965  Age: 49 y.o. MRN: [id number]  Chief Complaint  Patient presents with  . 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 Yesterday he took his break little of the day and during that breaking 8 some pizza. An hour or 2 later he noticed some itching of his face but didn't think much of it. Last night during the night his face got puffy and itching. He did not have any other rashes elsewhere. The only thing he can think of that was different was that the spaghetti which he ate had some mushrooms and it. He doesn't usually eat mushrooms, though certainly with that kind of a job he is around mushrooms often. He has been on the same medications for a long time and takes them regularly. His diabetes is well controlled with good blood sugars in the mornings and a recent A1c of 6.4. He has not had any major allergic reactions like this in the past. There is no breathing difficulty. No oral lesions. No other complaints. His wife thought his lips might be a little swollen on the lower lip, he is not convinced.  Current allergies, medications, problem list, past/family and social histories reviewed.  Objective:  BP 132/80 mmHg  Pulse 83  Temp(Src) 98.5 F (36.9 C) (Oral)  Resp 16  Ht 6\' 1"  (1.854 m)  Wt 259 lb 3.2 oz (117.572 kg)  BMI 34.20 kg/m2  SpO2 97%  Pleasant gentleman in no major acute distress but obviously a very allergic puffy face. His TMs are normal. Eyes PERRLA. Fundi benign. Conjunctiva not injected. The orbital areas are puffy. He has a fine rash across the lower half of his forehead and a little on his cheeks. There is a little between the nose and the upper lip. The neck has no significant nodes palpable. Chest is clear. Heart regular without murmur. No lesions in the mouth.  Assessment & Plan:   Assessment: No diagnosis found.    Plan: Very allergic appearing rash, etiology  uncertain. Even though he is diabetic shaky will be able to tolerate a course of prednisone to try and knock out this allergic rash. See orders  No orders of the defined types were placed in this encounter.    Meds ordered this encounter  Medications  . Vitamin D, Ergocalciferol, (DRISDOL) 50000 UNITS CAPS capsule    Sig: Take 50,000 Units by mouth once a week.    Refill:  4         There are no Patient Instructions on file for this visit.   No Follow-up on file.   Blanch Stang, MD 07/01/2015

## 2015-08-12 ENCOUNTER — Ambulatory Visit (INDEPENDENT_AMBULATORY_CARE_PROVIDER_SITE_OTHER): Payer: BLUE CROSS/BLUE SHIELD | Admitting: Podiatry

## 2015-08-12 ENCOUNTER — Encounter: Payer: Self-pay | Admitting: Podiatry

## 2015-08-12 DIAGNOSIS — E119 Type 2 diabetes mellitus without complications: Secondary | ICD-10-CM | POA: Diagnosis not present

## 2015-08-12 DIAGNOSIS — B351 Tinea unguium: Secondary | ICD-10-CM

## 2015-08-12 DIAGNOSIS — M79676 Pain in unspecified toe(s): Secondary | ICD-10-CM

## 2015-08-12 NOTE — Progress Notes (Signed)
Subjective:     Patient ID: Craig Yang, male   Craig BenedictB: 10/14/1965, 50 y.o.   MRN: [id number]  HPI.Complaint:  Visit Type: Patient returns to my office for continued preventative foot care services. Complaint: Patient states" my nails have grown long and thick and become painful to walk and wear shoes" Patient has been diagnosed with DM with no foot complications. The patient presents for preventative foot care services. No changes to ROS  Podiatric Exam: Vascular: dorsalis pedis and posterior tibial pulses are palpable bilateral. Capillary return is immediate. Temperature gradient is WNL. Skin turgor WNL  Sensorium: Normal Semmes Weinstein monofilament test. Normal tactile sensation bilaterally. Nail Exam: Pt has thick disfigured discolored nails with subungual debris noted bilateral entire nail hallux through fifth toenails Ulcer Exam: There is no evidence of ulcer or pre-ulcerative changes or infection. Orthopedic Exam: Muscle tone and strength are WNL. No limitations in general ROM. No crepitus or effusions noted. Foot type and digits show no abnormalities. Bony prominences are unremarkable. Skin: No Porokeratosis. No infection or ulcers  Diagnosis:  Onychomycosis, , Pain in right toe, pain in left toes  Treatment & Plan Procedures and Treatment: Consent by patient was obtained for treatment procedures. The patient understood the discussion of treatment and procedures well. All questions were answered thoroughly reviewed. Debridement of mycotic and hypertrophic toenails, 1 through 5 bilateral and clearing of subungual debris. No ulceration, no infection noted.  Return Visit-Office Procedure: Patient instructed to return to the office for a follow up visit 3 months for continued evaluation and treatment.    Helane GuntherGregory Eaven Schwager DPM   Review of Systems     Objective:   Physical Exam     Assessment:      Plan:

## 2015-10-22 DIAGNOSIS — G4733 Obstructive sleep apnea (adult) (pediatric): Secondary | ICD-10-CM | POA: Diagnosis not present

## 2015-10-22 DIAGNOSIS — R0902 Hypoxemia: Secondary | ICD-10-CM | POA: Diagnosis not present

## 2015-11-11 ENCOUNTER — Encounter: Payer: Self-pay | Admitting: Podiatry

## 2015-11-11 ENCOUNTER — Ambulatory Visit (INDEPENDENT_AMBULATORY_CARE_PROVIDER_SITE_OTHER): Payer: BLUE CROSS/BLUE SHIELD | Admitting: Podiatry

## 2015-11-11 DIAGNOSIS — M79676 Pain in unspecified toe(s): Secondary | ICD-10-CM | POA: Diagnosis not present

## 2015-11-11 DIAGNOSIS — B351 Tinea unguium: Secondary | ICD-10-CM | POA: Diagnosis not present

## 2015-11-11 DIAGNOSIS — E119 Type 2 diabetes mellitus without complications: Secondary | ICD-10-CM | POA: Diagnosis not present

## 2015-11-11 NOTE — Progress Notes (Signed)
Subjective:     Patient ID: Craig Yang, male   DOB: 10/31/1965, 50 y.o.   MRN: [id number]  HPI.Complaint:  Visit Type: Patient returns to my office for continued preventative foot care services. Complaint: Patient states" my nails have grown long and thick and become painful to walk and wear shoes" Patient has been diagnosed with DM with no foot complications. The patient presents for preventative foot care services. No changes to ROS  Podiatric Exam: Vascular: dorsalis pedis and posterior tibial pulses are palpable bilateral. Capillary return is immediate. Temperature gradient is WNL. Skin turgor WNL  Sensorium: Normal Semmes Weinstein monofilament test. Normal tactile sensation bilaterally. Nail Exam: Pt has thick disfigured discolored nails with subungual debris noted bilateral entire nail hallux through fifth toenails Ulcer Exam: There is no evidence of ulcer or pre-ulcerative changes or infection. Orthopedic Exam: Muscle tone and strength are WNL. No limitations in general ROM. No crepitus or effusions noted. Foot type and digits show no abnormalities. Bony prominences are unremarkable. Skin: No Porokeratosis. No infection or ulcers  Diagnosis:  Onychomycosis, , Pain in right toe, pain in left toes  Treatment & Plan Procedures and Treatment: Consent by patient was obtained for treatment procedures. The patient understood the discussion of treatment and procedures well. All questions were answered thoroughly reviewed. Debridement of mycotic and hypertrophic toenails, 1 through 5 bilateral and clearing of subungual debris. No ulceration, no infection noted.  Patient asked about diabetic shoes but I told him he needs to try powersteps. Return Visit-Office Procedure: Patient instructed to return to the office for a follow up visit 3 months for continued evaluation and treatment.    Helane GuntherGregory Dru Laurel DPM     Objective:        Assessment:      Plan:

## 2015-11-22 DIAGNOSIS — G4733 Obstructive sleep apnea (adult) (pediatric): Secondary | ICD-10-CM | POA: Diagnosis not present

## 2015-11-22 DIAGNOSIS — R0902 Hypoxemia: Secondary | ICD-10-CM | POA: Diagnosis not present

## 2016-01-13 DIAGNOSIS — J441 Chronic obstructive pulmonary disease with (acute) exacerbation: Secondary | ICD-10-CM | POA: Diagnosis not present

## 2016-01-13 DIAGNOSIS — Z79899 Other long term (current) drug therapy: Secondary | ICD-10-CM | POA: Diagnosis not present

## 2016-01-13 DIAGNOSIS — B356 Tinea cruris: Secondary | ICD-10-CM | POA: Diagnosis not present

## 2016-01-13 DIAGNOSIS — E119 Type 2 diabetes mellitus without complications: Secondary | ICD-10-CM | POA: Diagnosis not present

## 2016-01-13 DIAGNOSIS — I119 Hypertensive heart disease without heart failure: Secondary | ICD-10-CM | POA: Diagnosis not present

## 2016-01-13 DIAGNOSIS — M791 Myalgia: Secondary | ICD-10-CM | POA: Diagnosis not present

## 2016-01-13 DIAGNOSIS — E7800 Pure hypercholesterolemia, unspecified: Secondary | ICD-10-CM | POA: Diagnosis not present

## 2016-01-19 DIAGNOSIS — H401131 Primary open-angle glaucoma, bilateral, mild stage: Secondary | ICD-10-CM | POA: Diagnosis not present

## 2016-02-10 ENCOUNTER — Encounter: Payer: Self-pay | Admitting: Podiatry

## 2016-02-10 ENCOUNTER — Ambulatory Visit (INDEPENDENT_AMBULATORY_CARE_PROVIDER_SITE_OTHER): Payer: BLUE CROSS/BLUE SHIELD | Admitting: Podiatry

## 2016-02-10 DIAGNOSIS — B351 Tinea unguium: Secondary | ICD-10-CM | POA: Diagnosis not present

## 2016-02-10 DIAGNOSIS — M79676 Pain in unspecified toe(s): Secondary | ICD-10-CM

## 2016-02-10 DIAGNOSIS — E119 Type 2 diabetes mellitus without complications: Secondary | ICD-10-CM

## 2016-02-10 NOTE — Progress Notes (Signed)
Patient ID: Craig Yang, male   DOB: 08/17/1965, 50 y.o.   MRN: [id number] Complaint:  Visit Type: Patient returns to my office for continued preventative foot care services. Complaint: Patient states" my nails have grown long and thick and become painful to walk and wear shoes" Patient has been diagnosed with DM with no foot complications. The patient presents for preventative foot care services. No changes to ROS  Podiatric Exam: Vascular: dorsalis pedis and posterior tibial pulses are palpable bilateral. Capillary return is immediate. Temperature gradient is WNL. Skin turgor WNL  Sensorium: Normal Semmes Weinstein monofilament test. Normal tactile sensation bilaterally. Nail Exam: Pt has thick disfigured discolored nails with subungual debris noted bilateral entire nail hallux through fifth toenails Ulcer Exam: There is no evidence of ulcer or pre-ulcerative changes or infection. Orthopedic Exam: Muscle tone and strength are WNL. No limitations in general ROM. No crepitus or effusions noted. Foot type and digits show no abnormalities. Bony prominences are unremarkable. Skin: No Porokeratosis. No infection or ulcers  Diagnosis:  Onychomycosis, , Pain in right toe, pain in left toes  Treatment & Plan Procedures and Treatment: Consent by patient was obtained for treatment procedures. The patient understood the discussion of treatment and procedures well. All questions were answered thoroughly reviewed. Debridement of mycotic and hypertrophic toenails, 1 through 5 bilateral and clearing of subungual debris. No ulceration, no infection noted.  Return Visit-Office Procedure: Patient instructed to return to the office for a follow up visit 3 months for continued evaluation and treatment.    Helane GuntherGregory Scherry Laverne DPM

## 2016-04-20 ENCOUNTER — Ambulatory Visit: Payer: BLUE CROSS/BLUE SHIELD | Admitting: Podiatry

## 2016-05-11 ENCOUNTER — Ambulatory Visit: Payer: BLUE CROSS/BLUE SHIELD | Admitting: Podiatry

## 2016-07-13 DIAGNOSIS — E119 Type 2 diabetes mellitus without complications: Secondary | ICD-10-CM | POA: Diagnosis not present

## 2016-08-03 ENCOUNTER — Ambulatory Visit (INDEPENDENT_AMBULATORY_CARE_PROVIDER_SITE_OTHER): Payer: BLUE CROSS/BLUE SHIELD | Admitting: Podiatry

## 2016-08-03 ENCOUNTER — Encounter: Payer: Self-pay | Admitting: Podiatry

## 2016-08-03 VITALS — Ht 73.0 in | Wt 245.0 lb

## 2016-08-03 DIAGNOSIS — B351 Tinea unguium: Secondary | ICD-10-CM

## 2016-08-03 DIAGNOSIS — E119 Type 2 diabetes mellitus without complications: Secondary | ICD-10-CM

## 2016-08-03 DIAGNOSIS — M79676 Pain in unspecified toe(s): Secondary | ICD-10-CM

## 2016-08-03 NOTE — Progress Notes (Signed)
Patient ID: Craig Yang, male   DOB: 07/17/1966, 51 y.o.   MRN: [id number] Complaint:  Visit Type: Patient returns to my office for continued preventative foot care services. Complaint: Patient states" my nails have grown long and thick and become painful to walk and wear shoes" Patient has been diagnosed with DM with no foot complications. The patient presents for preventative foot care services. No changes to ROS  Podiatric Exam: Vascular: dorsalis pedis and posterior tibial pulses are palpable bilateral. Capillary return is immediate. Temperature gradient is WNL. Skin turgor WNL  Sensorium: Normal Semmes Weinstein monofilament test. Normal tactile sensation bilaterally. Nail Exam: Pt has thick disfigured discolored nails with subungual debris noted bilateral entire nail hallux through fifth toenails Ulcer Exam: There is no evidence of ulcer or pre-ulcerative changes or infection. Orthopedic Exam: Muscle tone and strength are WNL. No limitations in general ROM. No crepitus or effusions noted. Foot type and digits show no abnormalities. Bony prominences are unremarkable. Skin: No Porokeratosis. No infection or ulcers  Diagnosis:  Onychomycosis, , Pain in right toe, pain in left toes  Treatment & Plan Procedures and Treatment: Consent by patient was obtained for treatment procedures. The patient understood the discussion of treatment and procedures well. All questions were answered thoroughly reviewed. Debridement of mycotic and hypertrophic toenails, 1 through 5 bilateral and clearing of subungual debris. No ulceration, no infection noted.  Return Visit-Office Procedure: Patient instructed to return to the office for a follow up visit 3 months for continued evaluation and treatment.    Craig Yang DPM

## 2016-08-05 DIAGNOSIS — I119 Hypertensive heart disease without heart failure: Secondary | ICD-10-CM | POA: Diagnosis not present

## 2016-08-05 DIAGNOSIS — E7800 Pure hypercholesterolemia, unspecified: Secondary | ICD-10-CM | POA: Diagnosis not present

## 2016-08-05 DIAGNOSIS — Z125 Encounter for screening for malignant neoplasm of prostate: Secondary | ICD-10-CM | POA: Diagnosis not present

## 2016-08-05 DIAGNOSIS — J441 Chronic obstructive pulmonary disease with (acute) exacerbation: Secondary | ICD-10-CM | POA: Diagnosis not present

## 2016-08-05 DIAGNOSIS — Z6832 Body mass index (BMI) 32.0-32.9, adult: Secondary | ICD-10-CM | POA: Diagnosis not present

## 2016-08-05 DIAGNOSIS — E559 Vitamin D deficiency, unspecified: Secondary | ICD-10-CM | POA: Diagnosis not present

## 2016-08-05 DIAGNOSIS — Z79899 Other long term (current) drug therapy: Secondary | ICD-10-CM | POA: Diagnosis not present

## 2016-08-05 DIAGNOSIS — E119 Type 2 diabetes mellitus without complications: Secondary | ICD-10-CM | POA: Diagnosis not present

## 2016-11-02 ENCOUNTER — Ambulatory Visit (INDEPENDENT_AMBULATORY_CARE_PROVIDER_SITE_OTHER): Payer: BLUE CROSS/BLUE SHIELD | Admitting: Podiatry

## 2016-11-02 VITALS — Ht 73.0 in | Wt 245.0 lb

## 2016-11-02 DIAGNOSIS — B351 Tinea unguium: Secondary | ICD-10-CM | POA: Diagnosis not present

## 2016-11-02 DIAGNOSIS — E119 Type 2 diabetes mellitus without complications: Secondary | ICD-10-CM

## 2016-11-02 DIAGNOSIS — M79676 Pain in unspecified toe(s): Secondary | ICD-10-CM

## 2016-11-02 NOTE — Progress Notes (Signed)
Patient ID: Craig Yang, male   DOB: 11/09/1965, 51 y.o.   MRN: [id number] Complaint:  Visit Type: Patient returns to my office for continued preventative foot care services. Complaint: Patient states" my nails have grown long and thick and become painful to walk and wear shoes" Patient has been diagnosed with DM with no foot complications. The patient presents for preventative foot care services. No changes to ROS  Podiatric Exam: Vascular: dorsalis pedis and posterior tibial pulses are palpable bilateral. Capillary return is immediate. Temperature gradient is WNL. Skin turgor WNL  Sensorium: Normal Semmes Weinstein monofilament test. Normal tactile sensation bilaterally. Nail Exam: Pt has thick disfigured discolored nails with subungual debris noted bilateral entire nail hallux through fifth toenails Ulcer Exam: There is no evidence of ulcer or pre-ulcerative changes or infection. Orthopedic Exam: Muscle tone and strength are WNL. No limitations in general ROM. No crepitus or effusions noted. Foot type and digits show no abnormalities. Bony prominences are unremarkable. Skin: No Porokeratosis. No infection or ulcers  Diagnosis:  Onychomycosis, , Pain in right toe, pain in left toes  Treatment & Plan Procedures and Treatment: Consent by patient was obtained for treatment procedures. The patient understood the discussion of treatment and procedures well. All questions were answered thoroughly reviewed. Debridement of mycotic and hypertrophic toenails, 1 through 5 bilateral and clearing of subungual debris. No ulceration, no infection noted.  Return Visit-Office Procedure: Patient instructed to return to the office for a follow up visit 3 months for continued evaluation and treatment.    Helane Gunther DPM

## 2016-12-07 DIAGNOSIS — Z6832 Body mass index (BMI) 32.0-32.9, adult: Secondary | ICD-10-CM | POA: Diagnosis not present

## 2016-12-07 DIAGNOSIS — J449 Chronic obstructive pulmonary disease, unspecified: Secondary | ICD-10-CM | POA: Diagnosis not present

## 2016-12-07 DIAGNOSIS — R2241 Localized swelling, mass and lump, right lower limb: Secondary | ICD-10-CM | POA: Diagnosis not present

## 2016-12-07 DIAGNOSIS — E7800 Pure hypercholesterolemia, unspecified: Secondary | ICD-10-CM | POA: Diagnosis not present

## 2016-12-07 DIAGNOSIS — I119 Hypertensive heart disease without heart failure: Secondary | ICD-10-CM | POA: Diagnosis not present

## 2016-12-07 DIAGNOSIS — E559 Vitamin D deficiency, unspecified: Secondary | ICD-10-CM | POA: Diagnosis not present

## 2016-12-07 DIAGNOSIS — E119 Type 2 diabetes mellitus without complications: Secondary | ICD-10-CM | POA: Diagnosis not present

## 2016-12-08 ENCOUNTER — Other Ambulatory Visit (HOSPITAL_COMMUNITY): Payer: Self-pay | Admitting: Pulmonary Disease

## 2016-12-08 DIAGNOSIS — R52 Pain, unspecified: Secondary | ICD-10-CM

## 2016-12-13 ENCOUNTER — Ambulatory Visit (HOSPITAL_COMMUNITY)
Admission: RE | Admit: 2016-12-13 | Discharge: 2016-12-13 | Disposition: A | Discharge status: Home / Self Care | Payer: BLUE CROSS/BLUE SHIELD | Source: Ambulatory Visit | Attending: Pulmonary Disease | Admitting: Pulmonary Disease

## 2016-12-13 DIAGNOSIS — R52 Pain, unspecified: Secondary | ICD-10-CM | POA: Diagnosis not present

## 2016-12-13 DIAGNOSIS — M7989 Other specified soft tissue disorders: Secondary | ICD-10-CM | POA: Diagnosis not present

## 2016-12-13 NOTE — Progress Notes (Addendum)
VASCULAR LAB PRELIMINARY  PRELIMINARY  PRELIMINARY  PRELIMINARY  Right lower extremity venous duplex completed.    Preliminary report:  Right:  No evidence of DVT, superficial thrombosis, or Baker's cyst. Unable to reach the office. Report routed to Dr.Kilpatrick's mail box.  Amabel Stmarie, RVS 12/13/2016, 3:58 PM

## 2017-01-11 ENCOUNTER — Ambulatory Visit: Payer: BLUE CROSS/BLUE SHIELD | Admitting: Podiatry

## 2017-01-26 ENCOUNTER — Encounter: Payer: Self-pay | Admitting: Podiatry

## 2017-01-26 ENCOUNTER — Ambulatory Visit (INDEPENDENT_AMBULATORY_CARE_PROVIDER_SITE_OTHER): Payer: BLUE CROSS/BLUE SHIELD | Admitting: Podiatry

## 2017-01-26 DIAGNOSIS — M79676 Pain in unspecified toe(s): Secondary | ICD-10-CM

## 2017-01-26 DIAGNOSIS — B351 Tinea unguium: Secondary | ICD-10-CM | POA: Diagnosis not present

## 2017-01-26 NOTE — Progress Notes (Signed)
Patient ID: Craig Yang, male   DOB: 09/06/1965, 51 y.o.   MRN: [id number] Complaint:  Visit Type: Patient returns to my office for continued preventative foot care services. Complaint: Patient states" my nails have grown long and thick and become painful to walk and wear shoes" Patient has been diagnosed with DM with no foot complications. The patient presents for preventative foot care services. No changes to ROS  Podiatric Exam: Vascular: dorsalis pedis and posterior tibial pulses are palpable bilateral. Capillary return is immediate. Temperature gradient is WNL. Skin turgor WNL  Sensorium: Normal Semmes Weinstein monofilament test. Normal tactile sensation bilaterally. Nail Exam: Pt has thick disfigured discolored nails with subungual debris noted bilateral entire nail hallux through fifth toenails Ulcer Exam: There is no evidence of ulcer or pre-ulcerative changes or infection. Orthopedic Exam: Muscle tone and strength are WNL. No limitations in general ROM. No crepitus or effusions noted. Foot type and digits show no abnormalities. Bony prominences are unremarkable. Skin: No Porokeratosis. No infection or ulcers  Diagnosis:  Onychomycosis, , Pain in right toe, pain in left toes  Treatment & Plan Procedures and Treatment: Consent by patient was obtained for treatment procedures. The patient understood the discussion of treatment and procedures well. All questions were answered thoroughly reviewed. Debridement of mycotic and hypertrophic toenails, 1 through 5 bilateral and clearing of subungual debris. No ulceration, no infection noted.  Return Visit-Office Procedure: Patient instructed to return to the office for a follow up visit 3 months for continued evaluation and treatment.    Helane GuntherGregory Jewelene Mairena DPM

## 2017-02-28 DIAGNOSIS — E119 Type 2 diabetes mellitus without complications: Secondary | ICD-10-CM | POA: Diagnosis not present

## 2017-02-28 DIAGNOSIS — R42 Dizziness and giddiness: Secondary | ICD-10-CM | POA: Diagnosis not present

## 2017-02-28 DIAGNOSIS — I119 Hypertensive heart disease without heart failure: Secondary | ICD-10-CM | POA: Diagnosis not present

## 2017-02-28 DIAGNOSIS — J449 Chronic obstructive pulmonary disease, unspecified: Secondary | ICD-10-CM | POA: Diagnosis not present

## 2017-04-27 ENCOUNTER — Ambulatory Visit: Payer: BLUE CROSS/BLUE SHIELD | Admitting: Podiatry

## 2017-06-16 DIAGNOSIS — E559 Vitamin D deficiency, unspecified: Secondary | ICD-10-CM | POA: Diagnosis not present

## 2017-06-16 DIAGNOSIS — E1140 Type 2 diabetes mellitus with diabetic neuropathy, unspecified: Secondary | ICD-10-CM | POA: Diagnosis not present

## 2017-06-16 DIAGNOSIS — I119 Hypertensive heart disease without heart failure: Secondary | ICD-10-CM | POA: Diagnosis not present

## 2017-06-16 DIAGNOSIS — B356 Tinea cruris: Secondary | ICD-10-CM | POA: Diagnosis not present

## 2017-06-16 DIAGNOSIS — J441 Chronic obstructive pulmonary disease with (acute) exacerbation: Secondary | ICD-10-CM | POA: Diagnosis not present

## 2017-06-16 DIAGNOSIS — E119 Type 2 diabetes mellitus without complications: Secondary | ICD-10-CM | POA: Diagnosis not present

## 2017-06-16 DIAGNOSIS — E7800 Pure hypercholesterolemia, unspecified: Secondary | ICD-10-CM | POA: Diagnosis not present

## 2017-06-25 ENCOUNTER — Emergency Department (HOSPITAL_COMMUNITY)
Admission: EM | Admit: 2017-06-25 | Discharge: 2017-06-25 | Disposition: A | Discharge status: Home / Self Care | Payer: BLUE CROSS/BLUE SHIELD | Attending: Emergency Medicine | Admitting: Emergency Medicine

## 2017-06-25 ENCOUNTER — Encounter (HOSPITAL_COMMUNITY): Payer: Self-pay

## 2017-06-25 DIAGNOSIS — Y929 Unspecified place or not applicable: Secondary | ICD-10-CM | POA: Diagnosis not present

## 2017-06-25 DIAGNOSIS — I10 Essential (primary) hypertension: Secondary | ICD-10-CM | POA: Insufficient documentation

## 2017-06-25 DIAGNOSIS — Z91040 Latex allergy status: Secondary | ICD-10-CM | POA: Insufficient documentation

## 2017-06-25 DIAGNOSIS — X58XXXA Exposure to other specified factors, initial encounter: Secondary | ICD-10-CM | POA: Insufficient documentation

## 2017-06-25 DIAGNOSIS — Y999 Unspecified external cause status: Secondary | ICD-10-CM | POA: Diagnosis not present

## 2017-06-25 DIAGNOSIS — Y939 Activity, unspecified: Secondary | ICD-10-CM | POA: Diagnosis not present

## 2017-06-25 DIAGNOSIS — R21 Rash and other nonspecific skin eruption: Secondary | ICD-10-CM | POA: Diagnosis present

## 2017-06-25 DIAGNOSIS — E119 Type 2 diabetes mellitus without complications: Secondary | ICD-10-CM | POA: Diagnosis not present

## 2017-06-25 DIAGNOSIS — Z79899 Other long term (current) drug therapy: Secondary | ICD-10-CM | POA: Diagnosis not present

## 2017-06-25 DIAGNOSIS — Z87891 Personal history of nicotine dependence: Secondary | ICD-10-CM | POA: Insufficient documentation

## 2017-06-25 DIAGNOSIS — T7840XA Allergy, unspecified, initial encounter: Secondary | ICD-10-CM

## 2017-06-25 MED ORDER — TRIAMCINOLONE ACETONIDE 0.1 % EX CREA: 1.0000 "application " | TOPICAL_CREAM | Freq: Three times a day (TID) | CUTANEOUS | 0 refills | Status: DC

## 2017-06-25 NOTE — ED Triage Notes (Signed)
Pt started itching on his left arm on Thursday.  Friday morning noted blisters and swelling to left arm.

## 2017-06-25 NOTE — Discharge Instructions (Signed)
Keep the blisters covered when you go to work, to prevent them from bursting.  Use the prescription cream, 3 times a day on the red rash area of the left elbow.  Make sure that you wash the cream off at least once a day with soap and water.  The blisters will eventually pop and drain, continually cleaning process until the wounds heal.  Return here or see your doctor if the rash progresses, or you have other symptoms which are concerning.  To help the itching, take Claritin, antihistamine, 1 pill each day.

## 2017-06-25 NOTE — ED Notes (Signed)
Dr. Wentz at bedside. 

## 2017-06-25 NOTE — ED Provider Notes (Signed)
[facility] Note   CSN: [id number] Arrival date & time: 06/25/17  0803     History   Chief Complaint Chief Complaint  Patient presents with  . Blister    HPI Craig Yang is a 51 y.o. male.  He presents for evaluation of pruritic rash present for 3 days, gradually worse with blistering, noted each day.  No known new exposure, trauma, sick exposure, or preceding/recurrent symptoms.  He denies fever, chills, nausea, vomiting, weakness or dizziness.  Chest or back pain.  There are no known modifying factors.  HPI  Past Medical History:  Diagnosis Date  . Diabetes mellitus without complication (HCC)   . Hyperlipidemia   . Hypertension     Patient Active Problem List   Diagnosis Date Noted  . HYPERLIPIDEMIA 10/24/2009  . Essential hypertension 10/24/2009  . PNEUMONIA 10/24/2009  . Nonspecific (abnormal) findings on radiological and other examination of body structure 10/24/2009  . CT, CHEST, ABNORMAL 10/24/2009    History reviewed. No pertinent surgical history.     Home Medications    Prior to Admission medications   Medication Sig Start Date End Date Taking? Authorizing Provider  aspirin 81 MG tablet Take 81 mg by mouth daily.    [provider]  atorvastatin (LIPITOR) 20 MG tablet Take 20 mg by mouth daily.    [provider]  Budesonide-Formoterol Fumarate (SYMBICORT IN) Inhale into the lungs.    [provider]  Fluticasone-Salmeterol (ADVAIR) 100-50 MCG/DOSE AEPB Inhale 1 puff into the lungs 2 (two) times daily.    [provider]  furosemide (LASIX) 40 MG tablet Take 40 mg by mouth.    [provider]  latanoprost (XALATAN) 0.005 % ophthalmic solution  10/08/14   [provider]  olmesartan-hydrochlorothiazide (BENICAR HCT) 40-25 MG per tablet Take 1 tablet by mouth daily.    [provider]  predniSONE (DELTASONE) 20 MG tablet Take 1 tablet (20 mg  total) by mouth daily with breakfast. 07/01/15   Peyton NajjarHopper, David H, MD  SitaGLIPtin-MetFORMIN HCl (JANUMET XR) [phone] MG TB24 Take by mouth.    [provider]  triamcinolone cream (KENALOG) 0.1 %  08/10/14   [provider]  Vitamin D, Ergocalciferol, (DRISDOL) 50000 UNITS CAPS capsule Take 50,000 Units by mouth once a week. 03/31/15   [provider]    Family History Family History  Problem Relation Age of Onset  . Diabetes Father     Social History Social History   Tobacco Use  . Smoking status: Former Games developermoker  . Smokeless tobacco: Never Used  Substance Use Topics  . Alcohol use: Yes    Alcohol/week: 0.6 oz    Types: 1 Cans of beer per week  . Drug use: No     Allergies   Latex   Review of Systems Review of Systems  All other systems reviewed and are negative.    Physical Exam Updated Vital Signs BP (!) 184/107 (BP Location: Right Arm)   Pulse (!) 110   Temp 98.6 F (37 C) (Oral)   Resp 16   Ht 6\' 1"  (1.854 m)   Wt 113.4 kg (250 lb)   SpO2 98%   BMI 32.98 kg/m   Physical Exam  Constitutional: He is oriented to person, place, and time. He appears well-developed and well-nourished.  HENT:  Head: Normocephalic and atraumatic.  Right Ear: External ear normal.  Left Ear: External ear normal.  Eyes: Conjunctivae and EOM are normal. Pupils  are equal, round, and reactive to light.  Neck: Normal range of motion and phonation normal. Neck supple.  Cardiovascular: Normal rate.  Pulmonary/Chest: Effort normal. He exhibits no bony tenderness.  Musculoskeletal: Normal range of motion.  Neurological: He is alert and oriented to person, place, and time. No cranial nerve deficit or sensory deficit. He exhibits normal muscle tone. Coordination normal.  Skin: Skin is warm, dry and intact.  Red raised rash left elbow, discrete lesions, with 2 large blisters.  Image depicted below.  Psychiatric: He has a normal mood and affect. His behavior is  normal. Judgment and thought content normal.  Nursing note and vitals reviewed.           ED Treatments / Results  Labs (all labs ordered are listed, but only abnormal results are displayed) Labs Reviewed - No data to display  EKG  EKG Interpretation None       Radiology No results found.  Procedures Procedures (including critical care time)  Medications Ordered in ED Medications - No data to display   Initial Impression / Assessment and Plan / ED Course  I have reviewed the triage vital signs and the nursing notes.  Pertinent labs & imaging results that were available during my care of the patient were reviewed by me and considered in my medical decision making (see chart for details).      Patient Vitals for the past 24 hrs:  BP Temp Temp src Pulse Resp SpO2 Height Weight  06/25/17 0809 (!) 184/107 98.6 F (37 C) Oral (!) 110 16 98 % 6\' 1"  (1.854 m) 113.4 kg (250 lb)    9:37 AM Reevaluation with update and discussion. After initial assessment and treatment, an updated evaluation reveals no change in clinical status.  Findings discussed with the patient.  We discussed the low likelihood of shingles, and he prefers to wait to initiate treatment for that.  All questions answered. Mancel BaleElliott Kym Fenter      Final Clinical Impressions(s) / ED Diagnoses   Final diagnoses:  Allergic reaction, initial encounter    Nonspecific left elbow rash characterized by redness, raised lesions, and 2 distinct blisters.  The appearance of this rash, is nonspecific.  Shingles is considered however felt to be very unlikely secondary to the large blisters.  The etiology is most likely allergic dermatitis, which can be treated symptomatically.  There is no generalized rash, or evidence for severe illness.  Nursing Notes Reviewed/ Care Coordinated Applicable Imaging Reviewed Interpretation of Laboratory Data incorporated into ED treatment  The patient appears reasonably screened  and/or stabilized for discharge and I doubt any other medical condition or other [facility] requiring further screening, evaluation, or treatment in the ED at this time prior to discharge.  Plan: Home Medications-OTC antihistamine of choice, continue current medications; Home Treatments-rest, fluids,; return here if the recommended treatment, does not improve the symptoms; Recommended follow up-PCP, or return here as needed.   ED Discharge Orders    None       Mancel BaleWentz, Melody Savidge, MD 06/25/17 [phone]

## 2017-08-16 DIAGNOSIS — E119 Type 2 diabetes mellitus without complications: Secondary | ICD-10-CM | POA: Diagnosis not present

## 2017-09-15 DIAGNOSIS — E119 Type 2 diabetes mellitus without complications: Secondary | ICD-10-CM | POA: Diagnosis not present

## 2017-09-15 DIAGNOSIS — Z125 Encounter for screening for malignant neoplasm of prostate: Secondary | ICD-10-CM | POA: Diagnosis not present

## 2017-09-15 DIAGNOSIS — E1121 Type 2 diabetes mellitus with diabetic nephropathy: Secondary | ICD-10-CM | POA: Diagnosis not present

## 2017-09-15 DIAGNOSIS — E559 Vitamin D deficiency, unspecified: Secondary | ICD-10-CM | POA: Diagnosis not present

## 2017-09-15 DIAGNOSIS — E7800 Pure hypercholesterolemia, unspecified: Secondary | ICD-10-CM | POA: Diagnosis not present

## 2017-09-15 DIAGNOSIS — I119 Hypertensive heart disease without heart failure: Secondary | ICD-10-CM | POA: Diagnosis not present

## 2017-09-15 DIAGNOSIS — J441 Chronic obstructive pulmonary disease with (acute) exacerbation: Secondary | ICD-10-CM | POA: Diagnosis not present

## 2017-09-15 DIAGNOSIS — M791 Myalgia, unspecified site: Secondary | ICD-10-CM | POA: Diagnosis not present

## 2017-09-27 ENCOUNTER — Ambulatory Visit: Payer: BLUE CROSS/BLUE SHIELD | Admitting: Podiatry

## 2017-09-27 ENCOUNTER — Encounter: Payer: Self-pay | Admitting: Podiatry

## 2017-09-27 DIAGNOSIS — B351 Tinea unguium: Secondary | ICD-10-CM

## 2017-09-27 DIAGNOSIS — M79676 Pain in unspecified toe(s): Secondary | ICD-10-CM | POA: Diagnosis not present

## 2017-09-27 DIAGNOSIS — E119 Type 2 diabetes mellitus without complications: Secondary | ICD-10-CM | POA: Diagnosis not present

## 2017-09-27 NOTE — Progress Notes (Signed)
Patient ID: Craig Yang, male   DOB: 10/07/1965, 52 y.o.   MRN: [id number] Complaint:  Visit Type: Patient returns to my office for continued preventative foot care services. Complaint: Patient states" my nails have grown long and thick and become painful to walk and wear shoes" Patient has been diagnosed with DM with no foot complications. The patient presents for preventative foot care services. No changes to ROS  Podiatric Exam: Vascular: dorsalis pedis and posterior tibial pulses are palpable bilateral. Capillary return is immediate. Temperature gradient is WNL. Skin turgor WNL  Sensorium: Normal Semmes Weinstein monofilament test. Normal tactile sensation bilaterally. Nail Exam: Pt has thick disfigured discolored nails with subungual debris noted bilateral entire nail hallux through fifth toenails Ulcer Exam: There is no evidence of ulcer or pre-ulcerative changes or infection. Orthopedic Exam: Muscle tone and strength are WNL. No limitations in general ROM. No crepitus or effusions noted. Foot type and digits show no abnormalities. Bony prominences are unremarkable. Skin: No Porokeratosis. No infection or ulcers  Diagnosis:  Onychomycosis, , Pain in right toe, pain in left toes  Treatment & Plan Procedures and Treatment: Consent by patient was obtained for treatment procedures. The patient understood the discussion of treatment and procedures well. All questions were answered thoroughly reviewed. Debridement of mycotic and hypertrophic toenails, 1 through 5 bilateral and clearing of subungual debris. No ulceration, no infection noted.  Return Visit-Office Procedure: Patient instructed to return to the office for a follow up visit 3 months for continued evaluation and treatment.    Helane GuntherGregory Haiven Nardone DPM

## 2017-10-06 ENCOUNTER — Ambulatory Visit: Payer: BLUE CROSS/BLUE SHIELD | Admitting: Urgent Care

## 2017-10-06 ENCOUNTER — Encounter: Payer: Self-pay | Admitting: Urgent Care

## 2017-10-06 VITALS — BP 183/114 | HR 118 | Temp 98.5°F | Resp 18 | Ht 73.0 in | Wt 264.8 lb

## 2017-10-06 DIAGNOSIS — M79644 Pain in right finger(s): Secondary | ICD-10-CM

## 2017-10-06 DIAGNOSIS — I10 Essential (primary) hypertension: Secondary | ICD-10-CM | POA: Diagnosis not present

## 2017-10-06 DIAGNOSIS — R030 Elevated blood-pressure reading, without diagnosis of hypertension: Secondary | ICD-10-CM

## 2017-10-06 DIAGNOSIS — L03011 Cellulitis of right finger: Secondary | ICD-10-CM

## 2017-10-06 DIAGNOSIS — F1010 Alcohol abuse, uncomplicated: Secondary | ICD-10-CM

## 2017-10-06 MED ORDER — CEPHALEXIN 500 MG PO CAPS: 500.0000 mg | ORAL_CAPSULE | Freq: Three times a day (TID) | ORAL | 0 refills | Status: DC

## 2017-10-06 NOTE — Progress Notes (Signed)
   MRN: [id number] DOB: 03/04/1966  Subjective:   Virgil Benedictdrian L Guhl is a 52 y.o. male presenting for 1 week history of worsening right middle finger pain. Pain has gotten worse, is more swollen, has redness, warmth. Patient is diabetic. Regarding BP, denies dizziness, chronic headache, blurred vision, chest pain, shortness of breath, heart racing, palpitations, nausea, vomiting, abdominal pain, hematuria, lower leg swelling. Denies smoking cigarettes. Patient has BP managed by Dr. Petra KubaKilpatrick.   Koleen Nimroddrian has a current medication list which includes the following prescription(s): aspirin, budesonide-formoterol fumarate, fluticasone-salmeterol, furosemide, latanoprost, olmesartan-hydrochlorothiazide, prednisone, triamcinolone cream, and vitamin d (ergocalciferol). Also is allergic to latex.  Koleen Nimroddrian  has a past medical history of Diabetes mellitus without complication (HCC), Hyperlipidemia, and Hypertension. Denies past surgical history.  Objective:   Vitals: BP (!) 183/114   Pulse (!) 118   Temp 98.5 F (36.9 C) (Oral)   Resp 18   Ht 6\' 1"  (1.854 m)   Wt 264 lb 12.8 oz (120.1 kg)   SpO2 98%   BMI 34.94 kg/m   Physical Exam  Constitutional: He is oriented to person, place, and time. He appears well-developed and well-nourished.  HENT:  Mouth/Throat: Oropharynx is clear and moist.  Eyes: EOM are normal. Pupils are equal, round, and reactive to light. No scleral icterus.  Cardiovascular: Normal rate, regular rhythm and intact distal pulses. Exam reveals no gallop and no friction rub.  No murmur heard. Pulmonary/Chest: No respiratory distress. He has no wheezes. He has no rales.  Musculoskeletal:       Hands: Neurological: He is alert and oriented to person, place, and time.  Skin: Skin is warm and dry.  Psychiatric: He has a normal mood and affect.   PROCEDURE NOTE: I&D of Abscess Verbal consent obtained. Local anesthesia with 3cc of 2% lidocaine with 0.5% Marcaine. Site cleansed with  Betadine. Linear incision of ~0.5cm was made using a 11 blade, discharge of pus and serosanguinous fluid. Cleansed and dressed.   Assessment and Plan :   Paronychia of finger of right hand - Plan: WOUND CULTURE  Pain of finger of right hand  Essential hypertension  Elevated blood pressure reading  Alcohol abuse  Start Keflex, wound care reviewed. Patient is to follow up with PCP for management of HTN. Counseled patient that 5 beers per day plus liquor is too much alcohol and is a problem for his blood pressure. He is not willing to cut back on his drinking. Counseled on risks of uncontrolled HTN.  Wallis BambergMario Doyle Kunath, PA-C [facility] at [facility] [phone] 10/06/2017  3:56 PM

## 2017-10-06 NOTE — Patient Instructions (Addendum)
Do not drink alcohol while taking your antibiotic, Keflex. You may take 500mg  Tylenol every 6 hours for pain and inflammation.     Paronychia Paronychia is an infection of the skin. It happens near a fingernail or toenail. It may cause pain and swelling around the nail. Usually, it is not serious and it clears up with treatment. Follow these instructions at home:  Soak the fingers or toes in warm water as told by your doctor. You may be told to do this for 20 minutes, 2-3 times a day.  Keep the area dry when you are not soaking it.  Take medicines only as told by your doctor.  If you were given an antibiotic medicine, finish all of it even if you start to feel better.  Keep the affected area clean.  Do not try to drain a fluid-filled bump yourself.  Wear rubber gloves when putting your hands in water.  Wear gloves if your hands might touch cleaners or chemicals.  Follow your doctor's instructions about: ? Wound care. ? Bandage (dressing) changes and removal. Contact a doctor if:  Your symptoms get worse or do not improve.  You have a fever or chills.  You have redness spreading from the affected area.  You have more fluid, blood, or pus coming from the affected area.  Your finger or knuckle is swollen or is hard to move. This information is not intended to replace advice given to you by your health care provider. Make sure you discuss any questions you have with your health care provider. Document Released: 07/07/2009 Document Revised: 12/25/2015 Document Reviewed: 06/26/2014 Elsevier Interactive Patient Education  2018 [employer].     Hypertension Hypertension, commonly called high blood pressure, is when the force of blood pumping through the arteries is too strong. The arteries are the blood vessels that carry blood from the heart throughout the body. Hypertension forces the heart to work harder to pump blood and may cause arteries to become narrow or stiff. Having  untreated or uncontrolled hypertension can cause heart attacks, strokes, kidney disease, and other problems. A blood pressure reading consists of a higher number over a lower number. Ideally, your blood pressure should be below 120/80. The first ("top") number is called the systolic pressure. It is a measure of the pressure in your arteries as your heart beats. The second ("bottom") number is called the diastolic pressure. It is a measure of the pressure in your arteries as the heart relaxes. What are the causes? The cause of this condition is not known. What increases the risk? Some risk factors for high blood pressure are under your control. Others are not. Factors you can change  Smoking.  Having type 2 diabetes mellitus, high cholesterol, or both.  Not getting enough exercise or physical activity.  Being overweight.  Having too much fat, sugar, calories, or salt (sodium) in your diet.  Drinking too much alcohol. Factors that are difficult or impossible to change  Having chronic kidney disease.  Having a family history of high blood pressure.  Age. Risk increases with age.  Race. You may be at higher risk if you are African-American.  Gender. Men are at higher risk than women before age 3. After age 54, women are at higher risk than men.  Having obstructive sleep apnea.  Stress. What are the signs or symptoms? Extremely high blood pressure (hypertensive crisis) may cause:  Headache.  Anxiety.  Shortness of breath.  Nosebleed.  Nausea and vomiting.  Severe chest  pain.  Jerky movements you cannot control (seizures).  How is this diagnosed? This condition is diagnosed by measuring your blood pressure while you are seated, with your arm resting on a surface. The cuff of the blood pressure monitor will be placed directly against the skin of your upper arm at the level of your heart. It should be measured at least twice using the same arm. Certain conditions can  cause a difference in blood pressure between your right and left arms. Certain factors can cause blood pressure readings to be lower or higher than normal (elevated) for a short period of time:  When your blood pressure is higher when you are in a health care provider's office than when you are at home, this is called white coat hypertension. Most people with this condition do not need medicines.  When your blood pressure is higher at home than when you are in a health care provider's office, this is called masked hypertension. Most people with this condition may need medicines to control blood pressure.  If you have a high blood pressure reading during one visit or you have normal blood pressure with other risk factors:  You may be asked to return on a different day to have your blood pressure checked again.  You may be asked to monitor your blood pressure at home for 1 week or longer.  If you are diagnosed with hypertension, you may have other blood or imaging tests to help your health care provider understand your overall risk for other conditions. How is this treated? This condition is treated by making healthy lifestyle changes, such as eating healthy foods, exercising more, and reducing your alcohol intake. Your health care provider may prescribe medicine if lifestyle changes are not enough to get your blood pressure under control, and if:  Your systolic blood pressure is above 130.  Your diastolic blood pressure is above 80.  Your personal target blood pressure may vary depending on your medical conditions, your age, and other factors. Follow these instructions at home: Eating and drinking  Eat a diet that is high in fiber and potassium, and low in sodium, added sugar, and fat. An example eating plan is called the DASH (Dietary Approaches to Stop Hypertension) diet. To eat this way: ? Eat plenty of fresh fruits and vegetables. Try to fill half of your plate at each meal with fruits  and vegetables. ? Eat whole grains, such as whole wheat pasta, brown rice, or whole grain bread. Fill about one quarter of your plate with whole grains. ? Eat or drink low-fat dairy products, such as skim milk or low-fat yogurt. ? Avoid fatty cuts of meat, processed or cured meats, and poultry with skin. Fill about one quarter of your plate with lean proteins, such as fish, chicken without skin, beans, eggs, and tofu. ? Avoid premade and processed foods. These tend to be higher in sodium, added sugar, and fat.  Reduce your daily sodium intake. Most people with hypertension should eat less than 1,500 mg of sodium a day.  Limit alcohol intake to no more than 1 drink a day for nonpregnant women and 2 drinks a day for men. One drink equals 12 oz of beer, 5 oz of wine, or 1 oz of hard liquor. Lifestyle  Work with your health care provider to maintain a healthy body weight or to lose weight. Ask what an ideal weight is for you.  Get at least 30 minutes of exercise that causes your heart to  beat faster (aerobic exercise) most days of the week. Activities may include walking, swimming, or biking.  Include exercise to strengthen your muscles (resistance exercise), such as pilates or lifting weights, as part of your weekly exercise routine. Try to do these types of exercises for 30 minutes at least 3 days a week.  Do not use any products that contain nicotine or tobacco, such as cigarettes and e-cigarettes. If you need help quitting, ask your health care provider.  Monitor your blood pressure at home as told by your health care provider.  Keep all follow-up visits as told by your health care provider. This is important. Medicines  Take over-the-counter and prescription medicines only as told by your health care provider. Follow directions carefully. Blood pressure medicines must be taken as prescribed.  Do not skip doses of blood pressure medicine. Doing this puts you at risk for problems and can  make the medicine less effective.  Ask your health care provider about side effects or reactions to medicines that you should watch for. Contact a health care provider if:  You think you are having a reaction to a medicine you are taking.  You have headaches that keep coming back (recurring).  You feel dizzy.  You have swelling in your ankles.  You have trouble with your vision. Get help right away if:  You develop a severe headache or confusion.  You have unusual weakness or numbness.  You feel faint.  You have severe pain in your chest or abdomen.  You vomit repeatedly.  You have trouble breathing. Summary  Hypertension is when the force of blood pumping through your arteries is too strong. If this condition is not controlled, it may put you at risk for serious complications.  Your personal target blood pressure may vary depending on your medical conditions, your age, and other factors. For most people, a normal blood pressure is less than 120/80.  Hypertension is treated with lifestyle changes, medicines, or a combination of both. Lifestyle changes include weight loss, eating a healthy, low-sodium diet, exercising more, and limiting alcohol. This information is not intended to replace advice given to you by your health care provider. Make sure you discuss any questions you have with your health care provider. Document Released: 07/19/2005 Document Revised: 06/16/2016 Document Reviewed: 06/16/2016 Elsevier Interactive Patient Education  2018 [employer].     IF you received an x-ray today, you will receive an invoice from [facility]. Please contact [facility] at [phone] with questions or concerns regarding your invoice.   IF you received labwork today, you will receive an invoice from [facility]. Please contact [facility] at [phone] with questions or concerns regarding your invoice.   Our billing staff will not be able to assist you with  questions regarding bills from these companies.  You will be contacted with the lab results as soon as they are available. The fastest way to get your results is to activate your My Chart account. Instructions are located on the last page of this paperwork. If you have not heard from [place] regarding the results in 2 weeks, please contact this office.

## 2017-10-08 LAB — WOUND CULTURE

## 2018-01-24 DIAGNOSIS — I119 Hypertensive heart disease without heart failure: Secondary | ICD-10-CM | POA: Diagnosis not present

## 2018-01-24 DIAGNOSIS — M609 Myositis, unspecified: Secondary | ICD-10-CM | POA: Diagnosis not present

## 2018-01-24 DIAGNOSIS — J449 Chronic obstructive pulmonary disease, unspecified: Secondary | ICD-10-CM | POA: Diagnosis not present

## 2018-01-24 DIAGNOSIS — E1129 Type 2 diabetes mellitus with other diabetic kidney complication: Secondary | ICD-10-CM | POA: Diagnosis not present

## 2018-04-14 ENCOUNTER — Ambulatory Visit: Payer: BLUE CROSS/BLUE SHIELD | Admitting: Podiatry

## 2018-04-14 ENCOUNTER — Encounter: Payer: Self-pay | Admitting: Podiatry

## 2018-04-14 DIAGNOSIS — B351 Tinea unguium: Secondary | ICD-10-CM

## 2018-04-14 DIAGNOSIS — M79676 Pain in unspecified toe(s): Secondary | ICD-10-CM

## 2018-04-14 DIAGNOSIS — E119 Type 2 diabetes mellitus without complications: Secondary | ICD-10-CM

## 2018-04-14 NOTE — Progress Notes (Signed)
Patient ID: Craig Yang, male   DOB: 11/02/1965, 52 y.o.   MRN: [id number] Complaint:  Visit Type: Patient returns to my office for continued preventative foot care services. Complaint: Patient states" my nails have grown long and thick and become painful to walk and wear shoes" Patient has been diagnosed with DM with no foot complications. The patient presents for preventative foot care services. No changes to ROS  Podiatric Exam: Vascular: dorsalis pedis and posterior tibial pulses are palpable bilateral. Capillary return is immediate. Temperature gradient is WNL. Skin turgor WNL  Sensorium: Normal Semmes Weinstein monofilament test. Normal tactile sensation bilaterally. Nail Exam: Pt has thick disfigured discolored nails with subungual debris noted bilateral entire nail hallux through fifth toenails Ulcer Exam: There is no evidence of ulcer or pre-ulcerative changes or infection. Orthopedic Exam: Muscle tone and strength are WNL. No limitations in general ROM. No crepitus or effusions noted. Foot type and digits show no abnormalities. Bony prominences are unremarkable. Skin: No Porokeratosis. No infection or ulcers  Diagnosis:  Onychomycosis, , Pain in right toe, pain in left toes  Treatment & Plan Procedures and Treatment: Consent by patient was obtained for treatment procedures. The patient understood the discussion of treatment and procedures well. All questions were answered thoroughly reviewed. Debridement of mycotic and hypertrophic toenails, 1 through 5 bilateral and clearing of subungual debris. No ulceration, no infection noted.  Return Visit-Office Procedure: Patient instructed to return to the office for a follow up visit 3 months for continued evaluation and treatment.    Ames Hoban DPM 

## 2018-07-14 ENCOUNTER — Ambulatory Visit: Payer: BLUE CROSS/BLUE SHIELD | Admitting: Podiatry

## 2018-07-21 ENCOUNTER — Encounter: Payer: Self-pay | Admitting: Podiatry

## 2018-07-21 ENCOUNTER — Ambulatory Visit: Payer: BLUE CROSS/BLUE SHIELD | Admitting: Podiatry

## 2018-07-21 DIAGNOSIS — B351 Tinea unguium: Secondary | ICD-10-CM | POA: Diagnosis not present

## 2018-07-21 DIAGNOSIS — E119 Type 2 diabetes mellitus without complications: Secondary | ICD-10-CM | POA: Diagnosis not present

## 2018-07-21 DIAGNOSIS — M79676 Pain in unspecified toe(s): Secondary | ICD-10-CM | POA: Diagnosis not present

## 2018-07-21 NOTE — Progress Notes (Signed)
Patient ID: Craig Yang, male   DOB: 02/11/1966, 52 y.o.   MRN: [id number] Complaint:  Visit Type: Patient returns to my office for continued preventative foot care services. Complaint: Patient states" my nails have grown long and thick and become painful to walk and wear shoes" Patient has been diagnosed with DM with no foot complications. The patient presents for preventative foot care services. No changes to ROS  Podiatric Exam: Vascular: dorsalis pedis and posterior tibial pulses are palpable bilateral. Capillary return is immediate. Temperature gradient is WNL. Skin turgor WNL  Sensorium: Normal Semmes Weinstein monofilament test. Normal tactile sensation bilaterally. Nail Exam: Pt has thick disfigured discolored nails with subungual debris noted bilateral entire nail hallux through fifth toenails Ulcer Exam: There is no evidence of ulcer or pre-ulcerative changes or infection. Orthopedic Exam: Muscle tone and strength are WNL. No limitations in general ROM. No crepitus or effusions noted. Foot type and digits show no abnormalities. Bony prominences are unremarkable. Skin: No Porokeratosis. No infection or ulcers  Diagnosis:  Onychomycosis, , Pain in right toe, pain in left toes  Treatment & Plan Procedures and Treatment: Consent by patient was obtained for treatment procedures. The patient understood the discussion of treatment and procedures well. All questions were answered thoroughly reviewed. Debridement of mycotic and hypertrophic toenails, 1 through 5 bilateral and clearing of subungual debris. No ulceration, no infection noted.  Return Visit-Office Procedure: Patient instructed to return to the office for a follow up visit 3 months for continued evaluation and treatment.    Jamelia Varano DPM 

## 2018-08-10 DIAGNOSIS — I119 Hypertensive heart disease without heart failure: Secondary | ICD-10-CM | POA: Diagnosis not present

## 2018-08-10 DIAGNOSIS — M609 Myositis, unspecified: Secondary | ICD-10-CM | POA: Diagnosis not present

## 2018-08-10 DIAGNOSIS — E559 Vitamin D deficiency, unspecified: Secondary | ICD-10-CM | POA: Diagnosis not present

## 2018-08-10 DIAGNOSIS — E1129 Type 2 diabetes mellitus with other diabetic kidney complication: Secondary | ICD-10-CM | POA: Diagnosis not present

## 2018-08-10 DIAGNOSIS — E119 Type 2 diabetes mellitus without complications: Secondary | ICD-10-CM | POA: Diagnosis not present

## 2018-08-10 DIAGNOSIS — E7800 Pure hypercholesterolemia, unspecified: Secondary | ICD-10-CM | POA: Diagnosis not present

## 2018-08-10 DIAGNOSIS — J441 Chronic obstructive pulmonary disease with (acute) exacerbation: Secondary | ICD-10-CM | POA: Diagnosis not present

## 2018-10-20 ENCOUNTER — Ambulatory Visit: Payer: BLUE CROSS/BLUE SHIELD | Admitting: Podiatry

## 2018-11-29 ENCOUNTER — Encounter: Payer: Self-pay | Admitting: Podiatry

## 2018-11-29 ENCOUNTER — Other Ambulatory Visit: Payer: Self-pay

## 2018-11-29 ENCOUNTER — Ambulatory Visit: Payer: BLUE CROSS/BLUE SHIELD | Admitting: Podiatry

## 2018-11-29 VITALS — Temp 97.5°F

## 2018-11-29 DIAGNOSIS — B351 Tinea unguium: Secondary | ICD-10-CM

## 2018-11-29 DIAGNOSIS — E119 Type 2 diabetes mellitus without complications: Secondary | ICD-10-CM | POA: Diagnosis not present

## 2018-11-29 DIAGNOSIS — M79676 Pain in unspecified toe(s): Secondary | ICD-10-CM

## 2018-11-29 NOTE — Progress Notes (Signed)
Patient ID: Craig Yang, male   DOB: 1965-11-13, 53 y.o.   MRN: [id number] Complaint:  Visit Type: Patient returns to my office for continued preventative foot care services. Complaint: Patient states" my nails have grown long and thick and become painful to walk and wear shoes" Patient has been diagnosed with DM with no foot complications. The patient presents for preventative foot care services. No changes to ROS  Podiatric Exam: Vascular: dorsalis pedis and posterior tibial pulses are palpable bilateral. Capillary return is immediate. Temperature gradient is WNL. Skin turgor WNL  Sensorium: Normal Semmes Weinstein monofilament test. Normal tactile sensation bilaterally. Nail Exam: Pt has thick disfigured discolored nails with subungual debris noted bilateral entire nail hallux through fifth toenails Ulcer Exam: There is no evidence of ulcer or pre-ulcerative changes or infection. Orthopedic Exam: Muscle tone and strength are WNL. No limitations in general ROM. No crepitus or effusions noted. Foot type and digits show no abnormalities. Bony prominences are unremarkable. Skin: No Porokeratosis. No infection or ulcers  Diagnosis:  Onychomycosis, , Pain in right toe, pain in left toes  Treatment & Plan Procedures and Treatment: Consent by patient was obtained for treatment procedures. The patient understood the discussion of treatment and procedures well. All questions were answered thoroughly reviewed. Debridement of mycotic and hypertrophic toenails, 1 through 5 bilateral and clearing of subungual debris. No ulceration, no infection noted.  Return Visit-Office Procedure: Patient instructed to return to the office for a follow up visit 3 months for continued evaluation and treatment.    Helane Gunther DPM

## 2018-12-05 DIAGNOSIS — E119 Type 2 diabetes mellitus without complications: Secondary | ICD-10-CM | POA: Diagnosis not present

## 2018-12-14 DIAGNOSIS — J441 Chronic obstructive pulmonary disease with (acute) exacerbation: Secondary | ICD-10-CM | POA: Diagnosis not present

## 2018-12-14 DIAGNOSIS — I119 Hypertensive heart disease without heart failure: Secondary | ICD-10-CM | POA: Diagnosis not present

## 2018-12-14 DIAGNOSIS — Z79899 Other long term (current) drug therapy: Secondary | ICD-10-CM | POA: Diagnosis not present

## 2018-12-14 DIAGNOSIS — E1140 Type 2 diabetes mellitus with diabetic neuropathy, unspecified: Secondary | ICD-10-CM | POA: Diagnosis not present

## 2019-02-28 ENCOUNTER — Encounter: Payer: Self-pay | Admitting: Podiatry

## 2019-02-28 ENCOUNTER — Ambulatory Visit: Payer: BC Managed Care – PPO | Admitting: Podiatry

## 2019-02-28 ENCOUNTER — Other Ambulatory Visit: Payer: Self-pay

## 2019-02-28 VITALS — Temp 97.7°F

## 2019-02-28 DIAGNOSIS — M79676 Pain in unspecified toe(s): Secondary | ICD-10-CM | POA: Diagnosis not present

## 2019-02-28 DIAGNOSIS — B351 Tinea unguium: Secondary | ICD-10-CM | POA: Diagnosis not present

## 2019-02-28 DIAGNOSIS — E119 Type 2 diabetes mellitus without complications: Secondary | ICD-10-CM | POA: Diagnosis not present

## 2019-02-28 NOTE — Progress Notes (Signed)
Patient ID: Craig Yang, male   DOB: 1966/07/29, 53 y.o.   MRN: [id number] Complaint:  Visit Type: Patient returns to my office for continued preventative foot care services. Complaint: Patient states" my nails have grown long and thick and become painful to walk and wear shoes" Patient has been diagnosed with DM with no foot complications. The patient presents for preventative foot care services. No changes to ROS  Podiatric Exam: Vascular: dorsalis pedis and posterior tibial pulses are palpable bilateral. Capillary return is immediate. Temperature gradient is WNL. Skin turgor WNL  Sensorium: Normal Semmes Weinstein monofilament test. Normal tactile sensation bilaterally. Nail Exam: Pt has thick disfigured discolored nails with subungual debris noted bilateral entire nail hallux through fifth toenails Ulcer Exam: There is no evidence of ulcer or pre-ulcerative changes or infection. Orthopedic Exam: Muscle tone and strength are WNL. No limitations in general ROM. No crepitus or effusions noted. Foot type and digits show no abnormalities. Bony prominences are unremarkable. Skin: No Porokeratosis. No infection or ulcers  Diagnosis:  Onychomycosis, , Pain in right toe, pain in left toes  Treatment & Plan Procedures and Treatment: Consent by patient was obtained for treatment procedures. The patient understood the discussion of treatment and procedures well. All questions were answered thoroughly reviewed. Debridement of mycotic and hypertrophic toenails, 1 through 5 bilateral and clearing of subungual debris. No ulceration, no infection noted.  Return Visit-Office Procedure: Patient instructed to return to the office for a follow up visit 10 weeks  for continued evaluation and treatment.    Gardiner Barefoot DPM

## 2019-03-06 DIAGNOSIS — J441 Chronic obstructive pulmonary disease with (acute) exacerbation: Secondary | ICD-10-CM | POA: Diagnosis not present

## 2019-03-06 DIAGNOSIS — E7800 Pure hypercholesterolemia, unspecified: Secondary | ICD-10-CM | POA: Diagnosis not present

## 2019-03-06 DIAGNOSIS — I119 Hypertensive heart disease without heart failure: Secondary | ICD-10-CM | POA: Diagnosis not present

## 2019-03-06 DIAGNOSIS — E119 Type 2 diabetes mellitus without complications: Secondary | ICD-10-CM | POA: Diagnosis not present

## 2019-03-06 DIAGNOSIS — Z79899 Other long term (current) drug therapy: Secondary | ICD-10-CM | POA: Diagnosis not present

## 2019-03-06 DIAGNOSIS — E559 Vitamin D deficiency, unspecified: Secondary | ICD-10-CM | POA: Diagnosis not present

## 2019-03-06 DIAGNOSIS — M609 Myositis, unspecified: Secondary | ICD-10-CM | POA: Diagnosis not present

## 2019-03-06 DIAGNOSIS — E1140 Type 2 diabetes mellitus with diabetic neuropathy, unspecified: Secondary | ICD-10-CM | POA: Diagnosis not present

## 2019-05-15 ENCOUNTER — Encounter: Payer: Self-pay | Admitting: Podiatry

## 2019-05-15 ENCOUNTER — Ambulatory Visit: Payer: BC Managed Care – PPO | Admitting: Podiatry

## 2019-05-15 ENCOUNTER — Other Ambulatory Visit: Payer: Self-pay

## 2019-05-15 DIAGNOSIS — E119 Type 2 diabetes mellitus without complications: Secondary | ICD-10-CM

## 2019-05-15 DIAGNOSIS — M79676 Pain in unspecified toe(s): Secondary | ICD-10-CM

## 2019-05-15 DIAGNOSIS — B351 Tinea unguium: Secondary | ICD-10-CM | POA: Diagnosis not present

## 2019-05-15 NOTE — Progress Notes (Signed)
Patient ID: Craig Yang, male   DOB: 15-Feb-1966, 53 y.o.   MRN: [id number] Complaint:  Visit Type: Patient returns to my office for continued preventative foot care services. Complaint: Patient states" my nails have grown long and thick and become painful to walk and wear shoes" Patient has been diagnosed with DM with no foot complications. The patient presents for preventative foot care services. No changes to ROS  Podiatric Exam: Vascular: dorsalis pedis and posterior tibial pulses are palpable bilateral. Capillary return is immediate. Temperature gradient is WNL. Skin turgor WNL  Sensorium: Normal Semmes Weinstein monofilament test. Normal tactile sensation bilaterally. Nail Exam: Pt has thick disfigured discolored nails with subungual debris noted bilateral entire nail hallux through fifth toenails Ulcer Exam: There is no evidence of ulcer or pre-ulcerative changes or infection. Orthopedic Exam: Muscle tone and strength are WNL. No limitations in general ROM. No crepitus or effusions noted. Foot type and digits show no abnormalities. Bony prominences are unremarkable. Skin: No Porokeratosis. No infection or ulcers  Diagnosis:  Onychomycosis, , Pain in right toe, pain in left toes  Treatment & Plan Procedures and Treatment: Consent by patient was obtained for treatment procedures. The patient understood the discussion of treatment and procedures well. All questions were answered thoroughly reviewed. Debridement of mycotic and hypertrophic toenails, 1 through 5 bilateral and clearing of subungual debris. No ulceration, no infection noted.  Return Visit-Office Procedure: Patient instructed to return to the office for a follow up visit 10 weeks  for continued evaluation and treatment.    Gardiner Barefoot DPM

## 2019-07-20 ENCOUNTER — Ambulatory Visit: Payer: BC Managed Care – PPO | Admitting: Podiatry

## 2019-07-20 ENCOUNTER — Other Ambulatory Visit: Payer: Self-pay

## 2019-07-20 ENCOUNTER — Encounter: Payer: Self-pay | Admitting: Podiatry

## 2019-07-20 DIAGNOSIS — E119 Type 2 diabetes mellitus without complications: Secondary | ICD-10-CM | POA: Diagnosis not present

## 2019-07-20 DIAGNOSIS — B351 Tinea unguium: Secondary | ICD-10-CM

## 2019-07-20 DIAGNOSIS — M79676 Pain in unspecified toe(s): Secondary | ICD-10-CM

## 2019-07-20 NOTE — Progress Notes (Signed)
Patient ID: Craig Yang, male   DOB: [birth year], 53 y.o.   MRN: [id number] Complaint:  Visit Type: Patient returns to my office for continued preventative foot care services. Complaint: Patient states" my nails have grown long and thick and become painful to walk and wear shoes" Patient has been diagnosed with DM with no foot complications. The patient presents for preventative foot care services. No changes to ROS  Podiatric Exam: Vascular: dorsalis pedis and posterior tibial pulses are palpable bilateral. Capillary return is immediate. Temperature gradient is WNL. Skin turgor WNL  Sensorium: Normal Semmes Weinstein monofilament test. Normal tactile sensation bilaterally. Nail Exam: Pt has thick disfigured discolored nails with subungual debris noted bilateral entire nail hallux through fifth toenails Ulcer Exam: There is no evidence of ulcer or pre-ulcerative changes or infection. Orthopedic Exam: Muscle tone and strength are WNL. No limitations in general ROM. No crepitus or effusions noted. Foot type and digits show no abnormalities. Bony prominences are unremarkable. Skin: No Porokeratosis. No infection or ulcers.  Dry scaly heel callus.  Diagnosis:  Onychomycosis, , Pain in right toe, pain in left toes  Treatment & Plan Procedures and Treatment: Consent by patient was obtained for treatment procedures. The patient understood the discussion of treatment and procedures well. All questions were answered thoroughly reviewed. Debridement of mycotic and hypertrophic toenails, 1 through 5 bilateral and clearing of subungual debris. No ulceration, no infection noted. Told him to vaseline heels  B/L. Return Visit-Office Procedure: Patient instructed to return to the office for a follow up visit 10 weeks  for continued evaluation and treatment.    Gardiner Barefoot DPM

## 2019-08-06 DIAGNOSIS — E7800 Pure hypercholesterolemia, unspecified: Secondary | ICD-10-CM | POA: Diagnosis not present

## 2019-08-06 DIAGNOSIS — J441 Chronic obstructive pulmonary disease with (acute) exacerbation: Secondary | ICD-10-CM | POA: Diagnosis not present

## 2019-08-06 DIAGNOSIS — M791 Myalgia, unspecified site: Secondary | ICD-10-CM | POA: Diagnosis not present

## 2019-08-06 DIAGNOSIS — I119 Hypertensive heart disease without heart failure: Secondary | ICD-10-CM | POA: Diagnosis not present

## 2019-08-06 DIAGNOSIS — E1140 Type 2 diabetes mellitus with diabetic neuropathy, unspecified: Secondary | ICD-10-CM | POA: Diagnosis not present

## 2019-08-06 DIAGNOSIS — Z79899 Other long term (current) drug therapy: Secondary | ICD-10-CM | POA: Diagnosis not present

## 2019-08-06 DIAGNOSIS — E119 Type 2 diabetes mellitus without complications: Secondary | ICD-10-CM | POA: Diagnosis not present

## 2019-08-06 DIAGNOSIS — E559 Vitamin D deficiency, unspecified: Secondary | ICD-10-CM | POA: Diagnosis not present

## 2019-08-13 DIAGNOSIS — E119 Type 2 diabetes mellitus without complications: Secondary | ICD-10-CM | POA: Diagnosis not present

## 2019-09-04 DIAGNOSIS — R311 Benign essential microscopic hematuria: Secondary | ICD-10-CM | POA: Diagnosis not present

## 2019-09-04 DIAGNOSIS — R8281 Pyuria: Secondary | ICD-10-CM | POA: Diagnosis not present

## 2019-09-26 ENCOUNTER — Encounter: Payer: Self-pay | Admitting: Podiatry

## 2019-09-26 ENCOUNTER — Ambulatory Visit: Payer: BC Managed Care – PPO | Admitting: Podiatry

## 2019-09-26 ENCOUNTER — Other Ambulatory Visit: Payer: Self-pay

## 2019-09-26 DIAGNOSIS — M722 Plantar fascial fibromatosis: Secondary | ICD-10-CM | POA: Insufficient documentation

## 2019-09-26 DIAGNOSIS — M79676 Pain in unspecified toe(s): Secondary | ICD-10-CM

## 2019-09-26 DIAGNOSIS — E119 Type 2 diabetes mellitus without complications: Secondary | ICD-10-CM

## 2019-09-26 DIAGNOSIS — B351 Tinea unguium: Secondary | ICD-10-CM

## 2019-09-26 NOTE — Progress Notes (Signed)
Patient ID: Craig Yang, male   DOB: 05-Jun-1966, 54 y.o.   MRN: [id number] Complaint:  Visit Type: Patient returns to my office for continued preventative foot care services. Complaint: Patient states" my nails have grown long and thick and become painful to walk and wear shoes" Patient has been diagnosed with DM with no foot complications. The patient presents for preventative foot care services. No changes to ROS.  Patient says he has severe arch pain and stands  8 hours /day.  Podiatric Exam: Vascular: dorsalis pedis and posterior tibial pulses are palpable bilateral. Capillary return is immediate. Temperature gradient is WNL. Skin turgor WNL  Sensorium: Normal Semmes Weinstein monofilament test. Normal tactile sensation bilaterally. Nail Exam: Pt has thick disfigured discolored nails with subungual debris noted bilateral entire nail hallux through fifth toenails Ulcer Exam: There is no evidence of ulcer or pre-ulcerative changes or infection. Orthopedic Exam: Muscle tone and strength are WNL. No limitations in general ROM. No crepitus or effusions noted. Foot type and digits show no abnormalities. Bony prominences are unremarkable. Skin: No Porokeratosis. No infection or ulcers.  Dry scaly heel callus.  Diagnosis:  Onychomycosis, , Pain in right toe, pain in left toes  Treatment & Plan Procedures and Treatment: Consent by patient was obtained for treatment procedures. The patient understood the discussion of treatment and procedures well. All questions were answered thoroughly reviewed. Debridement of mycotic and hypertrophic toenails, 1 through 5 bilateral and clearing of subungual debris. No ulceration, no infection noted. Dispense powersteps  B/L  To return for permanent orthoses as needed. Return Visit-Office Procedure: Patient instructed to return to the office for a follow up visit 10 weeks  for continued evaluation and treatment.    Helane Gunther DPM

## 2019-12-12 ENCOUNTER — Ambulatory Visit: Payer: Self-pay | Admitting: Podiatry

## 2019-12-12 ENCOUNTER — Ambulatory Visit: Payer: BC Managed Care – PPO | Admitting: Podiatry

## 2019-12-12 ENCOUNTER — Other Ambulatory Visit: Payer: Self-pay

## 2019-12-12 ENCOUNTER — Encounter: Payer: Self-pay | Admitting: Podiatry

## 2019-12-12 VITALS — Temp 97.1°F

## 2019-12-12 DIAGNOSIS — B351 Tinea unguium: Secondary | ICD-10-CM

## 2019-12-12 DIAGNOSIS — M79676 Pain in unspecified toe(s): Secondary | ICD-10-CM

## 2019-12-12 DIAGNOSIS — E119 Type 2 diabetes mellitus without complications: Secondary | ICD-10-CM

## 2019-12-12 NOTE — Progress Notes (Signed)
Patient ID: Craig Yang, male   DOB: 05/22/1966, 53 y.o.   MRN: [id number] Complaint:  Visit Type: Patient returns to my office for continued preventative foot care services. Complaint: Patient states" my nails have grown long and thick and become painful to walk and wear shoes" Patient has been diagnosed with DM with no foot complications. The patient presents for preventative foot care services. No changes to ROS.    Podiatric Exam: Vascular: dorsalis pedis and posterior tibial pulses are palpable bilateral. Capillary return is immediate. Temperature gradient is WNL. Skin turgor WNL  Sensorium: Normal Semmes Weinstein monofilament test. Normal tactile sensation bilaterally. Nail Exam: Pt has thick disfigured discolored nails with subungual debris noted bilateral entire nail hallux through fifth toenails Ulcer Exam: There is no evidence of ulcer or pre-ulcerative changes or infection. Orthopedic Exam: Muscle tone and strength are WNL. No limitations in general ROM. No crepitus or effusions noted. Foot type and digits show no abnormalities. Bony prominences are unremarkable. Skin: No Porokeratosis. No infection or ulcers.  Dry scaly heel callus.  Diagnosis:  Onychomycosis, , Pain in right toe, pain in left toes  Treatment & Plan Procedures and Treatment: Consent by patient was obtained for treatment procedures. The patient understood the discussion of treatment and procedures well. All questions were answered thoroughly reviewed. Debridement of mycotic and hypertrophic toenails, 1 through 5 bilateral and clearing of subungual debris. No ulceration, no infection noted.  Return Visit-Office Procedure: Patient instructed to return to the office for a follow up visit 10 weeks  for continued evaluation and treatment.    Brooke Steinhilber DPM 

## 2019-12-17 DIAGNOSIS — E119 Type 2 diabetes mellitus without complications: Secondary | ICD-10-CM | POA: Diagnosis not present

## 2019-12-17 DIAGNOSIS — E7800 Pure hypercholesterolemia, unspecified: Secondary | ICD-10-CM | POA: Diagnosis not present

## 2019-12-17 DIAGNOSIS — I119 Hypertensive heart disease without heart failure: Secondary | ICD-10-CM | POA: Diagnosis not present

## 2019-12-17 DIAGNOSIS — Z79899 Other long term (current) drug therapy: Secondary | ICD-10-CM | POA: Diagnosis not present

## 2019-12-17 DIAGNOSIS — E1140 Type 2 diabetes mellitus with diabetic neuropathy, unspecified: Secondary | ICD-10-CM | POA: Diagnosis not present

## 2019-12-17 DIAGNOSIS — J441 Chronic obstructive pulmonary disease with (acute) exacerbation: Secondary | ICD-10-CM | POA: Diagnosis not present

## 2019-12-17 DIAGNOSIS — M609 Myositis, unspecified: Secondary | ICD-10-CM | POA: Diagnosis not present

## 2019-12-27 DIAGNOSIS — E119 Type 2 diabetes mellitus without complications: Secondary | ICD-10-CM | POA: Diagnosis not present

## 2020-01-22 DIAGNOSIS — H401131 Primary open-angle glaucoma, bilateral, mild stage: Secondary | ICD-10-CM | POA: Diagnosis not present

## 2020-02-21 DIAGNOSIS — H1031 Unspecified acute conjunctivitis, right eye: Secondary | ICD-10-CM | POA: Diagnosis not present

## 2020-02-27 DIAGNOSIS — Z23 Encounter for immunization: Secondary | ICD-10-CM | POA: Diagnosis not present

## 2020-03-18 ENCOUNTER — Ambulatory Visit (INDEPENDENT_AMBULATORY_CARE_PROVIDER_SITE_OTHER): Payer: BC Managed Care – PPO | Admitting: Podiatry

## 2020-03-18 ENCOUNTER — Other Ambulatory Visit: Payer: Self-pay

## 2020-03-18 ENCOUNTER — Encounter: Payer: Self-pay | Admitting: Podiatry

## 2020-03-18 DIAGNOSIS — E119 Type 2 diabetes mellitus without complications: Secondary | ICD-10-CM | POA: Diagnosis not present

## 2020-03-18 DIAGNOSIS — M79676 Pain in unspecified toe(s): Secondary | ICD-10-CM

## 2020-03-18 DIAGNOSIS — B351 Tinea unguium: Secondary | ICD-10-CM

## 2020-03-18 NOTE — Progress Notes (Signed)
Patient ID: Craig Yang, male   DOB: 02/04/1966, 54 y.o.   MRN: [id number] Complaint:  Visit Type: Patient returns to my office for continued preventative foot care services. Complaint: Patient states" my nails have grown long and thick and become painful to walk and wear shoes" Patient has been diagnosed with DM with no foot complications. The patient presents for preventative foot care services. No changes to ROS.    Podiatric Exam: Vascular: dorsalis pedis and posterior tibial pulses are palpable bilateral. Capillary return is immediate. Temperature gradient is WNL. Skin turgor WNL  Sensorium: Normal Semmes Weinstein monofilament test. Normal tactile sensation bilaterally. Nail Exam: Pt has thick disfigured discolored nails with subungual debris noted bilateral entire nail hallux through fifth toenails Ulcer Exam: There is no evidence of ulcer or pre-ulcerative changes or infection. Orthopedic Exam: Muscle tone and strength are WNL. No limitations in general ROM. No crepitus or effusions noted. Foot type and digits show no abnormalities. Bony prominences are unremarkable. Skin: No Porokeratosis. No infection or ulcers.  Dry scaly heel callus.  Diagnosis:  Onychomycosis, , Pain in right toe, pain in left toes  Treatment & Plan Procedures and Treatment: Consent by patient was obtained for treatment procedures. The patient understood the discussion of treatment and procedures well. All questions were answered thoroughly reviewed. Debridement of mycotic and hypertrophic toenails, 1 through 5 bilateral and clearing of subungual debris. No ulceration, no infection noted.  Return Visit-Office Procedure: Patient instructed to return to the office for a follow up visit 10 weeks  for continued evaluation and treatment.    Helane Gunther DPM

## 2020-04-22 DIAGNOSIS — E119 Type 2 diabetes mellitus without complications: Secondary | ICD-10-CM | POA: Diagnosis not present

## 2020-04-22 DIAGNOSIS — E7800 Pure hypercholesterolemia, unspecified: Secondary | ICD-10-CM | POA: Diagnosis not present

## 2020-04-22 DIAGNOSIS — J441 Chronic obstructive pulmonary disease with (acute) exacerbation: Secondary | ICD-10-CM | POA: Diagnosis not present

## 2020-04-22 DIAGNOSIS — I119 Hypertensive heart disease without heart failure: Secondary | ICD-10-CM | POA: Diagnosis not present

## 2020-04-22 DIAGNOSIS — E559 Vitamin D deficiency, unspecified: Secondary | ICD-10-CM | POA: Diagnosis not present

## 2020-04-22 DIAGNOSIS — E1140 Type 2 diabetes mellitus with diabetic neuropathy, unspecified: Secondary | ICD-10-CM | POA: Diagnosis not present

## 2020-04-22 DIAGNOSIS — M609 Myositis, unspecified: Secondary | ICD-10-CM | POA: Diagnosis not present

## 2020-06-18 ENCOUNTER — Ambulatory Visit: Payer: BC Managed Care – PPO | Admitting: Podiatry

## 2020-06-18 ENCOUNTER — Other Ambulatory Visit: Payer: Self-pay

## 2020-06-18 ENCOUNTER — Encounter: Payer: Self-pay | Admitting: Podiatry

## 2020-06-18 DIAGNOSIS — M79676 Pain in unspecified toe(s): Secondary | ICD-10-CM | POA: Diagnosis not present

## 2020-06-18 DIAGNOSIS — L853 Xerosis cutis: Secondary | ICD-10-CM

## 2020-06-18 DIAGNOSIS — B351 Tinea unguium: Secondary | ICD-10-CM | POA: Diagnosis not present

## 2020-06-18 DIAGNOSIS — E119 Type 2 diabetes mellitus without complications: Secondary | ICD-10-CM | POA: Diagnosis not present

## 2020-06-18 MED ORDER — AMMONIUM LACTATE 12 % EX CREA: TOPICAL_CREAM | CUTANEOUS | 0 refills | Status: AC | PRN

## 2020-06-18 NOTE — Addendum Note (Signed)
Addended byMaury Dus, Idania Desouza L on: 06/18/2020 04:28 PM   Modules accepted: Orders

## 2020-06-18 NOTE — Progress Notes (Signed)
Patient ID: Craig Yang, male   DOB: 08-22-1965, 54 y.o.   MRN: [id number] Complaint:  Visit Type: Patient returns to my office for continued preventative foot care services. Complaint: Patient states" my nails have grown long and thick and become painful to walk and wear shoes" Patient has been diagnosed with DM with no foot complications. The patient presents for preventative foot care services. No changes to ROS.    Podiatric Exam: Vascular: dorsalis pedis and posterior tibial pulses are palpable bilateral. Capillary return is immediate. Temperature gradient is WNL. Skin turgor WNL  Sensorium: Normal Semmes Weinstein monofilament test. Normal tactile sensation bilaterally. Nail Exam: Pt has thick disfigured discolored nails with subungual debris noted bilateral entire nail hallux through fifth toenails Ulcer Exam: There is no evidence of ulcer or pre-ulcerative changes or infection. Orthopedic Exam: Muscle tone and strength are WNL. No limitations in general ROM. No crepitus or effusions noted. Foot type and digits show no abnormalities. Bony prominences are unremarkable. Skin: No Porokeratosis. No infection or ulcers.  Dry scaly heel callus.  Diagnosis:  Onychomycosis, , Pain in right toe, pain in left toes  Treatment & Plan Procedures and Treatment: Consent by patient was obtained for treatment procedures. The patient understood the discussion of treatment and procedures well. All questions were answered thoroughly reviewed. Debridement of mycotic and hypertrophic toenails, 1 through 5 bilateral and clearing of subungual debris. No ulceration, no infection noted.  Prescribe lac-hydrin. Return Visit-Office Procedure: Patient instructed to return to the office for a follow up visit 10 weeks  for continued evaluation and treatment.    Helane Gunther DPM

## 2020-07-21 DIAGNOSIS — E7800 Pure hypercholesterolemia, unspecified: Secondary | ICD-10-CM | POA: Diagnosis not present

## 2020-07-21 DIAGNOSIS — E1140 Type 2 diabetes mellitus with diabetic neuropathy, unspecified: Secondary | ICD-10-CM | POA: Diagnosis not present

## 2020-07-21 DIAGNOSIS — I119 Hypertensive heart disease without heart failure: Secondary | ICD-10-CM | POA: Diagnosis not present

## 2020-07-21 DIAGNOSIS — M609 Myositis, unspecified: Secondary | ICD-10-CM | POA: Diagnosis not present

## 2020-07-21 DIAGNOSIS — J441 Chronic obstructive pulmonary disease with (acute) exacerbation: Secondary | ICD-10-CM | POA: Diagnosis not present

## 2020-07-21 DIAGNOSIS — E119 Type 2 diabetes mellitus without complications: Secondary | ICD-10-CM | POA: Diagnosis not present

## 2020-07-21 DIAGNOSIS — E559 Vitamin D deficiency, unspecified: Secondary | ICD-10-CM | POA: Diagnosis not present

## 2020-08-27 ENCOUNTER — Other Ambulatory Visit: Payer: Self-pay

## 2020-08-27 ENCOUNTER — Ambulatory Visit: Payer: BC Managed Care – PPO | Admitting: Podiatry

## 2020-08-27 ENCOUNTER — Encounter: Payer: Self-pay | Admitting: Podiatry

## 2020-08-27 DIAGNOSIS — L853 Xerosis cutis: Secondary | ICD-10-CM | POA: Diagnosis not present

## 2020-08-27 DIAGNOSIS — E119 Type 2 diabetes mellitus without complications: Secondary | ICD-10-CM

## 2020-08-27 DIAGNOSIS — M79676 Pain in unspecified toe(s): Secondary | ICD-10-CM

## 2020-08-27 DIAGNOSIS — B351 Tinea unguium: Secondary | ICD-10-CM

## 2020-08-27 NOTE — Progress Notes (Signed)
Patient ID: Craig Yang, male   DOB: 1965-08-26, 55 y.o.   MRN: [id number] Complaint:  Visit Type: Patient returns to my office for continued preventative foot care services. Complaint: Patient states" my nails have grown long and thick and become painful to walk and wear shoes" Patient has been diagnosed with DM with no foot complications. The patient presents for preventative foot care services. No changes to ROS.    Podiatric Exam: Vascular: dorsalis pedis and posterior tibial pulses are palpable bilateral. Capillary return is immediate. Temperature gradient is WNL. Skin turgor WNL  Sensorium: Normal Semmes Weinstein monofilament test. Normal tactile sensation bilaterally. Nail Exam: Pt has thick disfigured discolored nails with subungual debris noted bilateral entire nail hallux through fifth toenails Ulcer Exam: There is no evidence of ulcer or pre-ulcerative changes or infection. Orthopedic Exam: Muscle tone and strength are WNL. No limitations in general ROM. No crepitus or effusions noted. Foot type and digits show no abnormalities. Bony prominences are unremarkable. Skin: No Porokeratosis. No infection or ulcers.  Dry scaly heel callus.  Diagnosis:  Onychomycosis, , Pain in right toe, pain in left toes  Treatment & Plan Procedures and Treatment: Consent by patient was obtained for treatment procedures. The patient understood the discussion of treatment and procedures well. All questions were answered thoroughly reviewed. Debridement of mycotic and hypertrophic toenails, 1 through 5 bilateral and clearing of subungual debris. No ulceration, no infection noted.  Prescribe lac-hydrin. Prescribe sorbothane insoles. Return Visit-Office Procedure: Patient instructed to return to the office for a follow up visit 10 weeks  for continued evaluation and treatment.    Helane Gunther DPM

## 2020-09-23 ENCOUNTER — Ambulatory Visit: Payer: BC Managed Care – PPO | Admitting: Podiatry

## 2020-10-30 DIAGNOSIS — E7800 Pure hypercholesterolemia, unspecified: Secondary | ICD-10-CM | POA: Diagnosis not present

## 2020-10-30 DIAGNOSIS — E119 Type 2 diabetes mellitus without complications: Secondary | ICD-10-CM | POA: Diagnosis not present

## 2020-10-30 DIAGNOSIS — I119 Hypertensive heart disease without heart failure: Secondary | ICD-10-CM | POA: Diagnosis not present

## 2020-10-30 DIAGNOSIS — E559 Vitamin D deficiency, unspecified: Secondary | ICD-10-CM | POA: Diagnosis not present

## 2020-11-05 ENCOUNTER — Ambulatory Visit (INDEPENDENT_AMBULATORY_CARE_PROVIDER_SITE_OTHER): Payer: BC Managed Care – PPO | Admitting: Podiatry

## 2020-11-05 ENCOUNTER — Encounter: Payer: Self-pay | Admitting: Podiatry

## 2020-11-05 ENCOUNTER — Other Ambulatory Visit: Payer: Self-pay

## 2020-11-05 DIAGNOSIS — M79676 Pain in unspecified toe(s): Secondary | ICD-10-CM | POA: Diagnosis not present

## 2020-11-05 DIAGNOSIS — B351 Tinea unguium: Secondary | ICD-10-CM | POA: Diagnosis not present

## 2020-11-05 DIAGNOSIS — E119 Type 2 diabetes mellitus without complications: Secondary | ICD-10-CM

## 2020-11-05 NOTE — Progress Notes (Signed)
Patient ID: Craig Yang, male   DOB: 29-Sep-1965, 55 y.o.   MRN: [id number] Complaint:  Visit Type: Patient returns to my office for continued preventative foot care services. Complaint: Patient states" my nails have grown long and thick and become painful to walk and wear shoes" Patient has been diagnosed with DM with no foot complications. The patient presents for preventative foot care services. No changes to ROS.    Podiatric Exam: Vascular: dorsalis pedis and posterior tibial pulses are palpable bilateral. Capillary return is immediate. Temperature gradient is WNL. Skin turgor WNL  Sensorium: Normal Semmes Weinstein monofilament test. Normal tactile sensation bilaterally. Nail Exam: Pt has thick disfigured discolored nails with subungual debris noted bilateral entire nail hallux through fifth toenails Ulcer Exam: There is no evidence of ulcer or pre-ulcerative changes or infection. Orthopedic Exam: Muscle tone and strength are WNL. No limitations in general ROM. No crepitus or effusions noted. Foot type and digits show no abnormalities. Bony prominences are unremarkable. Skin: No Porokeratosis. No infection or ulcers.  Dry scaly heel callus.  Diagnosis:  Onychomycosis, , Pain in right toe, pain in left toes  Treatment & Plan Procedures and Treatment: Consent by patient was obtained for treatment procedures. The patient understood the discussion of treatment and procedures well. All questions were answered thoroughly reviewed. Debridement of mycotic and hypertrophic toenails, 1 through 5 bilateral and clearing of subungual debris. No ulceration, no infection noted.  Prescribe lac-hydrin. Prescribe sorbothane insoles. Return Visit-Office Procedure: Patient instructed to return to the office for a follow up visit 10 weeks  for continued evaluation and treatment.    Helane Gunther DPM

## 2021-01-16 ENCOUNTER — Ambulatory Visit: Payer: BC Managed Care – PPO | Admitting: Podiatry

## 2021-01-16 ENCOUNTER — Other Ambulatory Visit: Payer: Self-pay

## 2021-01-16 ENCOUNTER — Encounter: Payer: Self-pay | Admitting: Podiatry

## 2021-01-16 DIAGNOSIS — L853 Xerosis cutis: Secondary | ICD-10-CM | POA: Diagnosis not present

## 2021-01-16 DIAGNOSIS — B351 Tinea unguium: Secondary | ICD-10-CM | POA: Diagnosis not present

## 2021-01-16 DIAGNOSIS — E119 Type 2 diabetes mellitus without complications: Secondary | ICD-10-CM

## 2021-01-16 DIAGNOSIS — M79676 Pain in unspecified toe(s): Secondary | ICD-10-CM

## 2021-01-16 NOTE — Progress Notes (Signed)
Patient ID: Craig Yang, male   DOB: [birth year], 55 y.o.   MRN: [id number] Complaint:  Visit Type: Patient returns to my office for continued preventative foot care services. Complaint: Patient states" my nails have grown long and thick and become painful to walk and wear shoes" Patient has been diagnosed with DM with no foot complications. The patient presents for preventative foot care services. No changes to ROS.    Podiatric Exam: Vascular: dorsalis pedis and posterior tibial pulses are palpable bilateral. Capillary return is immediate. Temperature gradient is WNL. Skin turgor WNL  Sensorium: Normal Semmes Weinstein monofilament test. Normal tactile sensation bilaterally. Nail Exam: Pt has thick disfigured discolored nails with subungual debris noted bilateral entire nail hallux through fifth toenails Ulcer Exam: There is no evidence of ulcer or pre-ulcerative changes or infection. Orthopedic Exam: Muscle tone and strength are WNL. No limitations in general ROM. No crepitus or effusions noted. Foot type and digits show no abnormalities. Bony prominences are unremarkable. Skin: No Porokeratosis. No infection or ulcers.  Dry scaly heel callus.  Diagnosis:  Onychomycosis, , Pain in right toe, pain in left toes  Treatment & Plan Procedures and Treatment: Consent by patient was obtained for treatment procedures. The patient understood the discussion of treatment and procedures well. All questions were answered thoroughly reviewed. Debridement of mycotic and hypertrophic toenails, 1 through 5 bilateral and clearing of subungual debris. No ulceration, no infection noted.  Patient requsts full evaluation of his feet due to severe pain walking at work. Return Visit-Office Procedure: Patient instructed to return to the office for a follow up visit 10 weeks  for continued evaluation and treatment.    Helane Gunther DPM

## 2021-01-21 ENCOUNTER — Ambulatory Visit: Payer: BC Managed Care – PPO | Admitting: Podiatry

## 2021-04-14 DIAGNOSIS — M609 Myositis, unspecified: Secondary | ICD-10-CM | POA: Diagnosis not present

## 2021-04-14 DIAGNOSIS — E1140 Type 2 diabetes mellitus with diabetic neuropathy, unspecified: Secondary | ICD-10-CM | POA: Diagnosis not present

## 2021-04-14 DIAGNOSIS — E7800 Pure hypercholesterolemia, unspecified: Secondary | ICD-10-CM | POA: Diagnosis not present

## 2021-04-14 DIAGNOSIS — E559 Vitamin D deficiency, unspecified: Secondary | ICD-10-CM | POA: Diagnosis not present

## 2021-04-14 DIAGNOSIS — J441 Chronic obstructive pulmonary disease with (acute) exacerbation: Secondary | ICD-10-CM | POA: Diagnosis not present

## 2021-04-14 DIAGNOSIS — I119 Hypertensive heart disease without heart failure: Secondary | ICD-10-CM | POA: Diagnosis not present

## 2021-04-14 DIAGNOSIS — Z79899 Other long term (current) drug therapy: Secondary | ICD-10-CM | POA: Diagnosis not present

## 2021-04-14 DIAGNOSIS — E119 Type 2 diabetes mellitus without complications: Secondary | ICD-10-CM | POA: Diagnosis not present

## 2021-04-20 ENCOUNTER — Other Ambulatory Visit: Payer: Self-pay

## 2021-04-20 ENCOUNTER — Ambulatory Visit (INDEPENDENT_AMBULATORY_CARE_PROVIDER_SITE_OTHER): Payer: BC Managed Care – PPO | Admitting: Podiatry

## 2021-04-20 ENCOUNTER — Encounter: Payer: Self-pay | Admitting: Podiatry

## 2021-04-20 DIAGNOSIS — E119 Type 2 diabetes mellitus without complications: Secondary | ICD-10-CM

## 2021-04-20 DIAGNOSIS — B351 Tinea unguium: Secondary | ICD-10-CM | POA: Diagnosis not present

## 2021-04-20 DIAGNOSIS — L853 Xerosis cutis: Secondary | ICD-10-CM

## 2021-04-20 DIAGNOSIS — M79676 Pain in unspecified toe(s): Secondary | ICD-10-CM | POA: Diagnosis not present

## 2021-04-20 NOTE — Progress Notes (Signed)
Patient ID: Craig Yang, male   DOB: [birth year], 55 y.o.   MRN: [id number] Complaint:  Visit Type: Patient returns to my office for continued preventative foot care services. Complaint: Patient states" my nails have grown long and thick and become painful to walk and wear shoes" Patient has been diagnosed with DM with no foot complications. The patient presents for preventative foot care services. No changes to ROS.    Podiatric Exam: Vascular: dorsalis pedis and posterior tibial pulses are palpable bilateral. Capillary return is immediate. Temperature gradient is WNL. Skin turgor WNL  Sensorium: Normal Semmes Weinstein monofilament test. Normal tactile sensation bilaterally. Nail Exam: Pt has thick disfigured discolored nails with subungual debris noted bilateral entire nail hallux through fifth toenails Ulcer Exam: There is no evidence of ulcer or pre-ulcerative changes or infection. Orthopedic Exam: Muscle tone and strength are WNL. No limitations in general ROM. No crepitus or effusions noted. Foot type and digits show no abnormalities. Bony prominences are unremarkable.  Pes planus. Skin: No Porokeratosis. No infection or ulcers.  Dry scaly skin heels have resolved.  Diagnosis:  Onychomycosis, , Pain in right toe, pain in left toes  Treatment & Plan Procedures and Treatment: Consent by patient was obtained for treatment procedures. The patient understood the discussion of treatment and procedures well. All questions were answered thoroughly reviewed. Debridement of mycotic and hypertrophic toenails, 1 through 5 bilateral and clearing of subungual debris. No ulceration, no infection noted.  Told patient to pick up crepe soles for work shoes. Return Visit-Office Procedure: Patient instructed to return to the office for a follow up visit 10 weeks  for continued evaluation and treatment.    Helane Gunther DPM

## 2021-06-10 ENCOUNTER — Ambulatory Visit (INDEPENDENT_AMBULATORY_CARE_PROVIDER_SITE_OTHER): Payer: BC Managed Care – PPO

## 2021-06-10 ENCOUNTER — Encounter: Payer: Self-pay | Admitting: Podiatry

## 2021-06-10 ENCOUNTER — Ambulatory Visit: Payer: BC Managed Care – PPO | Admitting: Podiatry

## 2021-06-10 ENCOUNTER — Other Ambulatory Visit: Payer: Self-pay

## 2021-06-10 DIAGNOSIS — M722 Plantar fascial fibromatosis: Secondary | ICD-10-CM | POA: Diagnosis not present

## 2021-06-10 MED ORDER — DICLOFENAC SODIUM 75 MG PO TBEC: 75.0000 mg | DELAYED_RELEASE_TABLET | Freq: Two times a day (BID) | ORAL | 2 refills | Status: DC

## 2021-06-10 MED ORDER — TRIAMCINOLONE ACETONIDE 10 MG/ML IJ SUSP
20.0000 mg | Freq: Once | INTRAMUSCULAR | Status: AC
Start: 2021-06-10 — End: 2021-06-10
  Administered 2021-06-10: 20 mg

## 2021-06-11 NOTE — Progress Notes (Signed)
Subjective:   Patient ID: Craig Yang, male   DOB: 55 y.o.   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  Reappoint to recheck and did write for diclofenac 75 mg twice daily to try to reduce the inflammation  X-rays indicate spur formation moderate collapse of the arch no indications of advanced arthritis stress fracture     Patient

## 2021-07-20 ENCOUNTER — Ambulatory Visit: Payer: BC Managed Care – PPO | Admitting: Podiatry

## 2021-08-19 ENCOUNTER — Other Ambulatory Visit: Payer: Self-pay

## 2021-08-19 ENCOUNTER — Ambulatory Visit: Payer: BC Managed Care – PPO | Admitting: Podiatry

## 2021-08-19 ENCOUNTER — Encounter: Payer: Self-pay | Admitting: Podiatry

## 2021-08-19 DIAGNOSIS — L853 Xerosis cutis: Secondary | ICD-10-CM

## 2021-08-19 DIAGNOSIS — E119 Type 2 diabetes mellitus without complications: Secondary | ICD-10-CM

## 2021-08-19 DIAGNOSIS — M79676 Pain in unspecified toe(s): Secondary | ICD-10-CM | POA: Diagnosis not present

## 2021-08-19 DIAGNOSIS — B351 Tinea unguium: Secondary | ICD-10-CM | POA: Diagnosis not present

## 2021-08-19 NOTE — Progress Notes (Signed)
Patient ID: Craig Yang, male   DOB: 07/29/1966, 55 y.o.   MRN: [id number] Complaint:  Visit Type: Patient returns to my office for continued preventative foot care services. Complaint: Patient states" my nails have grown long and thick and become painful to walk and wear shoes" Patient has been diagnosed with DM with no foot complications. The patient presents for preventative foot care services. No changes to ROS.    Podiatric Exam: Vascular: dorsalis pedis and posterior tibial pulses are palpable bilateral. Capillary return is immediate. Temperature gradient is WNL. Skin turgor WNL  Sensorium: Normal Semmes Weinstein monofilament test. Normal tactile sensation bilaterally. Nail Exam: Pt has thick disfigured discolored nails with subungual debris noted bilateral entire nail hallux through fifth toenails Ulcer Exam: There is no evidence of ulcer or pre-ulcerative changes or infection. Orthopedic Exam: Muscle tone and strength are WNL. No limitations in general ROM. No crepitus or effusions noted. Foot type and digits show no abnormalities. Bony prominences are unremarkable.  Pes planus. Skin: No Porokeratosis. No infection or ulcers.  Dry scaly skin heels have resolved.  Diagnosis:  Onychomycosis, , Pain in right toe, pain in left toes  Treatment & Plan Procedures and Treatment: Consent by patient was obtained for treatment procedures. The patient understood the discussion of treatment and procedures well. All questions were answered thoroughly reviewed. Debridement of mycotic and hypertrophic toenails, 1 through 5 bilateral and clearing of subungual debris. No ulceration, no infection noted.  Return Visit-Office Procedure: Patient instructed to return to the office for a follow up visit 10 weeks  for continued evaluation and treatment.    Shakeisha Horine DPM 

## 2021-10-06 DIAGNOSIS — E1140 Type 2 diabetes mellitus with diabetic neuropathy, unspecified: Secondary | ICD-10-CM | POA: Diagnosis not present

## 2021-10-06 DIAGNOSIS — E119 Type 2 diabetes mellitus without complications: Secondary | ICD-10-CM | POA: Diagnosis not present

## 2021-10-06 DIAGNOSIS — E7800 Pure hypercholesterolemia, unspecified: Secondary | ICD-10-CM | POA: Diagnosis not present

## 2021-10-06 DIAGNOSIS — J449 Chronic obstructive pulmonary disease, unspecified: Secondary | ICD-10-CM | POA: Diagnosis not present

## 2021-10-06 DIAGNOSIS — E559 Vitamin D deficiency, unspecified: Secondary | ICD-10-CM | POA: Diagnosis not present

## 2021-10-06 DIAGNOSIS — Z79899 Other long term (current) drug therapy: Secondary | ICD-10-CM | POA: Diagnosis not present

## 2021-10-06 DIAGNOSIS — I119 Hypertensive heart disease without heart failure: Secondary | ICD-10-CM | POA: Diagnosis not present

## 2021-10-06 DIAGNOSIS — M609 Myositis, unspecified: Secondary | ICD-10-CM | POA: Diagnosis not present

## 2021-10-28 ENCOUNTER — Encounter: Payer: Self-pay | Admitting: Podiatry

## 2021-10-28 ENCOUNTER — Ambulatory Visit: Payer: BC Managed Care – PPO | Admitting: Podiatry

## 2021-10-28 DIAGNOSIS — B351 Tinea unguium: Secondary | ICD-10-CM

## 2021-10-28 DIAGNOSIS — E119 Type 2 diabetes mellitus without complications: Secondary | ICD-10-CM

## 2021-10-28 DIAGNOSIS — M79676 Pain in unspecified toe(s): Secondary | ICD-10-CM | POA: Diagnosis not present

## 2021-10-28 NOTE — Progress Notes (Signed)
Patient ID: Craig Yang, male   DOB: 10/15/1965, 55 y.o.   MRN: [id number] Complaint:  Visit Type: Patient returns to my office for continued preventative foot care services. Complaint: Patient states" my nails have grown long and thick and become painful to walk and wear shoes" Patient has been diagnosed with DM with no foot complications. The patient presents for preventative foot care services. No changes to ROS.    Podiatric Exam: Vascular: dorsalis pedis and posterior tibial pulses are palpable bilateral. Capillary return is immediate. Temperature gradient is WNL. Skin turgor WNL  Sensorium: Normal Semmes Weinstein monofilament test. Normal tactile sensation bilaterally. Nail Exam: Pt has thick disfigured discolored nails with subungual debris noted bilateral entire nail hallux through fifth toenails Ulcer Exam: There is no evidence of ulcer or pre-ulcerative changes or infection. Orthopedic Exam: Muscle tone and strength are WNL. No limitations in general ROM. No crepitus or effusions noted. Foot type and digits show no abnormalities. Bony prominences are unremarkable.  Pes planus. Skin: No Porokeratosis. No infection or ulcers.  Dry scaly skin heels have resolved.  Diagnosis:  Onychomycosis, , Pain in right toe, pain in left toes  Treatment & Plan Procedures and Treatment: Consent by patient was obtained for treatment procedures. The patient understood the discussion of treatment and procedures well. All questions were answered thoroughly reviewed. Debridement of mycotic and hypertrophic toenails, 1 through 5 bilateral and clearing of subungual debris. No ulceration, no infection noted.  Return Visit-Office Procedure: Patient instructed to return to the office for a follow up visit 10 weeks  for continued evaluation and treatment.    Kelon Easom DPM 

## 2022-01-08 ENCOUNTER — Encounter: Payer: Self-pay | Admitting: Podiatry

## 2022-01-08 ENCOUNTER — Ambulatory Visit: Payer: BC Managed Care – PPO | Admitting: Podiatry

## 2022-01-08 DIAGNOSIS — E119 Type 2 diabetes mellitus without complications: Secondary | ICD-10-CM | POA: Diagnosis not present

## 2022-01-08 DIAGNOSIS — M79676 Pain in unspecified toe(s): Secondary | ICD-10-CM | POA: Diagnosis not present

## 2022-01-08 DIAGNOSIS — B351 Tinea unguium: Secondary | ICD-10-CM

## 2022-01-08 DIAGNOSIS — L853 Xerosis cutis: Secondary | ICD-10-CM | POA: Diagnosis not present

## 2022-01-08 NOTE — Progress Notes (Signed)
Patient ID: Craig Yang, male   DOB: 1966-04-15, 56 y.o.   MRN: [id number] Complaint:  Visit Type: Patient returns to my office for continued preventative foot care services. Complaint: Patient states" my nails have grown long and thick and become painful to walk and wear shoes" Patient has been diagnosed with DM with no foot complications. The patient presents for preventative foot care services. No changes to ROS.    Podiatric Exam: Vascular: dorsalis pedis and posterior tibial pulses are palpable bilateral. Capillary return is immediate. Temperature gradient is WNL. Skin turgor WNL  Sensorium: Normal Semmes Weinstein monofilament test. Normal tactile sensation bilaterally. Nail Exam: Pt has thick disfigured discolored nails with subungual debris noted bilateral entire nail hallux through fifth toenails Ulcer Exam: There is no evidence of ulcer or pre-ulcerative changes or infection. Orthopedic Exam: Muscle tone and strength are WNL. No limitations in general ROM. No crepitus or effusions noted. Foot type and digits show no abnormalities. Bony prominences are unremarkable.  Pes planus. Skin: No Porokeratosis. No infection or ulcers.  Dry scaly skin heels have resolved.  Diagnosis:  Onychomycosis, , Pain in right toe, pain in left toes  Treatment & Plan Procedures and Treatment: Consent by patient was obtained for treatment procedures. The patient understood the discussion of treatment and procedures well. All questions were answered thoroughly reviewed. Debridement of mycotic and hypertrophic toenails, 1 through 5 bilateral and clearing of subungual debris. No ulceration, no infection noted.  Dispense powerstep insole. Return Visit-Office Procedure: Patient instructed to return to the office for a follow up visit 10 weeks  for continued evaluation and treatment.    Helane Gunther DPM

## 2022-03-22 ENCOUNTER — Ambulatory Visit: Payer: BC Managed Care – PPO | Admitting: Podiatry

## 2022-03-22 ENCOUNTER — Encounter: Payer: Self-pay | Admitting: Podiatry

## 2022-03-22 DIAGNOSIS — M79676 Pain in unspecified toe(s): Secondary | ICD-10-CM

## 2022-03-22 DIAGNOSIS — L853 Xerosis cutis: Secondary | ICD-10-CM

## 2022-03-22 DIAGNOSIS — E119 Type 2 diabetes mellitus without complications: Secondary | ICD-10-CM

## 2022-03-22 DIAGNOSIS — B351 Tinea unguium: Secondary | ICD-10-CM

## 2022-03-22 NOTE — Progress Notes (Signed)
Patient ID: Craig Yang, male   DOB: August 24, 1965, 56 y.o.   MRN: [id number] Complaint:  Visit Type: Patient returns to my office for continued preventative foot care services. Complaint: Patient states" my nails have grown long and thick and become painful to walk and wear shoes" Patient has been diagnosed with DM with no foot complications. The patient presents for preventative foot care services. No changes to ROS.    Podiatric Exam: Vascular: dorsalis pedis and posterior tibial pulses are palpable bilateral. Capillary return is immediate. Temperature gradient is WNL. Skin turgor WNL  Sensorium: Normal Semmes Weinstein monofilament test. Normal tactile sensation bilaterally. Nail Exam: Pt has thick disfigured discolored nails with subungual debris noted bilateral entire nail hallux through fifth toenails Ulcer Exam: There is no evidence of ulcer or pre-ulcerative changes or infection. Orthopedic Exam: Muscle tone and strength are WNL. No limitations in general ROM. No crepitus or effusions noted. Foot type and digits show no abnormalities. Bony prominences are unremarkable.  Pes planus. Skin: No Porokeratosis. No infection or ulcers.  Dry scaly skin heels have resolved.  Diagnosis:  Onychomycosis, , Pain in right toe, pain in left toes  Treatment & Plan Procedures and Treatment: Consent by patient was obtained for treatment procedures. The patient understood the discussion of treatment and procedures well. All questions were answered thoroughly reviewed. Debridement of mycotic and hypertrophic toenails, 1 through 5 bilateral and clearing of subungual debris. No ulceration, no infection noted.  Return Visit-Office Procedure: Patient instructed to return to the office for a follow up visit 10 weeks  for continued evaluation and treatment.    Helane Gunther DPM

## 2022-04-08 DIAGNOSIS — I119 Hypertensive heart disease without heart failure: Secondary | ICD-10-CM | POA: Diagnosis not present

## 2022-04-08 DIAGNOSIS — E119 Type 2 diabetes mellitus without complications: Secondary | ICD-10-CM | POA: Diagnosis not present

## 2022-04-08 DIAGNOSIS — E559 Vitamin D deficiency, unspecified: Secondary | ICD-10-CM | POA: Diagnosis not present

## 2022-04-08 DIAGNOSIS — E1140 Type 2 diabetes mellitus with diabetic neuropathy, unspecified: Secondary | ICD-10-CM | POA: Diagnosis not present

## 2022-04-08 DIAGNOSIS — J449 Chronic obstructive pulmonary disease, unspecified: Secondary | ICD-10-CM | POA: Diagnosis not present

## 2022-04-08 DIAGNOSIS — E7800 Pure hypercholesterolemia, unspecified: Secondary | ICD-10-CM | POA: Diagnosis not present

## 2022-04-08 DIAGNOSIS — M609 Myositis, unspecified: Secondary | ICD-10-CM | POA: Diagnosis not present

## 2022-06-01 ENCOUNTER — Ambulatory Visit: Payer: BC Managed Care – PPO | Admitting: Podiatry

## 2022-06-01 ENCOUNTER — Encounter: Payer: Self-pay | Admitting: Podiatry

## 2022-06-01 DIAGNOSIS — L853 Xerosis cutis: Secondary | ICD-10-CM | POA: Diagnosis not present

## 2022-06-01 DIAGNOSIS — B351 Tinea unguium: Secondary | ICD-10-CM

## 2022-06-01 DIAGNOSIS — E119 Type 2 diabetes mellitus without complications: Secondary | ICD-10-CM

## 2022-06-01 DIAGNOSIS — M79676 Pain in unspecified toe(s): Secondary | ICD-10-CM | POA: Diagnosis not present

## 2022-06-01 NOTE — Progress Notes (Signed)
Patient ID: Craig Yang, male   DOB: [birth year], 56 y.o.   MRN: [id number] Complaint:  Visit Type: Patient returns to my office for continued preventative foot care services. Complaint: Patient states" my nails have grown long and thick and become painful to walk and wear shoes" Patient has been diagnosed with DM with no foot complications. The patient presents for preventative foot care services. No changes to ROS.    Podiatric Exam: Vascular: dorsalis pedis and posterior tibial pulses are palpable bilateral. Capillary return is immediate. Temperature gradient is WNL. Skin turgor WNL  Sensorium: Normal Semmes Weinstein monofilament test. Normal tactile sensation bilaterally. Nail Exam: Pt has thick disfigured discolored nails with subungual debris noted bilateral entire nail hallux through fifth toenails Ulcer Exam: There is no evidence of ulcer or pre-ulcerative changes or infection. Orthopedic Exam: Muscle tone and strength are WNL. No limitations in general ROM. No crepitus or effusions noted. Foot type and digits show no abnormalities. Bony prominences are unremarkable.  Pes planus. Skin: No Porokeratosis. No infection or ulcers.  Dry scaly skin heels have resolved.  Diagnosis:  Onychomycosis, , Pain in right toe, pain in left toes  Treatment & Plan Procedures and Treatment: Consent by patient was obtained for treatment procedures. The patient understood the discussion of treatment and procedures well. All questions were answered thoroughly reviewed. Debridement of mycotic and hypertrophic toenails, 1 through 5 bilateral and clearing of subungual debris. No ulceration, no infection noted.  Return Visit-Office Procedure: Patient instructed to return to the office for a follow up visit 10 weeks  for continued evaluation and treatment.    Gardiner Barefoot DPM

## 2022-07-16 ENCOUNTER — Other Ambulatory Visit: Payer: Self-pay | Admitting: Podiatry

## 2022-08-10 ENCOUNTER — Ambulatory Visit: Payer: BC Managed Care – PPO | Admitting: Podiatry

## 2022-08-17 ENCOUNTER — Ambulatory Visit: Payer: BC Managed Care – PPO | Admitting: Podiatry

## 2022-08-17 DIAGNOSIS — L853 Xerosis cutis: Secondary | ICD-10-CM

## 2022-08-17 DIAGNOSIS — B351 Tinea unguium: Secondary | ICD-10-CM

## 2022-08-17 DIAGNOSIS — M79676 Pain in unspecified toe(s): Secondary | ICD-10-CM

## 2022-08-17 DIAGNOSIS — E119 Type 2 diabetes mellitus without complications: Secondary | ICD-10-CM

## 2022-08-17 NOTE — Progress Notes (Signed)
Patient ID: Craig Yang, male   DOB: 10/20/1965, 56 y.o.   MRN: [id number] Complaint:  Visit Type: Patient returns to my office for continued preventative foot care services. Complaint: Patient states" my nails have grown long and thick and become painful to walk and wear shoes" Patient has been diagnosed with DM with no foot complications. The patient presents for preventative foot care services. No changes to ROS.    Podiatric Exam: Vascular: dorsalis pedis and posterior tibial pulses are palpable bilateral. Capillary return is immediate. Temperature gradient is WNL. Skin turgor WNL  Sensorium: Normal Semmes Weinstein monofilament test. Normal tactile sensation bilaterally. Nail Exam: Pt has thick disfigured discolored nails with subungual debris noted bilateral entire nail hallux through fifth toenails Ulcer Exam: There is no evidence of ulcer or pre-ulcerative changes or infection. Orthopedic Exam: Muscle tone and strength are WNL. No limitations in general ROM. No crepitus or effusions noted. Foot type and digits show no abnormalities. Bony prominences are unremarkable.  Pes planus. Skin: No Porokeratosis. No infection or ulcers.  Dry scaly skin heels have returned.  Diagnosis:  Onychomycosis, , Pain in right toe, pain in left toes  Treatment & Plan Procedures and Treatment: Consent by patient was obtained for treatment procedures. The patient understood the discussion of treatment and procedures well. All questions were answered thoroughly reviewed. Debridement of mycotic and hypertrophic toenails, 1 through 5 bilateral and clearing of subungual debris. No ulceration, no infection noted.  Return Visit-Office Procedure: Patient instructed to return to the office for a follow up visit 10 weeks  for continued evaluation and treatment.    [name] DPM 

## 2022-09-02 DIAGNOSIS — E559 Vitamin D deficiency, unspecified: Secondary | ICD-10-CM | POA: Diagnosis not present

## 2022-09-02 DIAGNOSIS — E7800 Pure hypercholesterolemia, unspecified: Secondary | ICD-10-CM | POA: Diagnosis not present

## 2022-09-02 DIAGNOSIS — E1140 Type 2 diabetes mellitus with diabetic neuropathy, unspecified: Secondary | ICD-10-CM | POA: Diagnosis not present

## 2022-09-02 DIAGNOSIS — J449 Chronic obstructive pulmonary disease, unspecified: Secondary | ICD-10-CM | POA: Diagnosis not present

## 2022-09-02 DIAGNOSIS — Z79899 Other long term (current) drug therapy: Secondary | ICD-10-CM | POA: Diagnosis not present

## 2022-09-02 DIAGNOSIS — M609 Myositis, unspecified: Secondary | ICD-10-CM | POA: Diagnosis not present

## 2022-09-02 DIAGNOSIS — E119 Type 2 diabetes mellitus without complications: Secondary | ICD-10-CM | POA: Diagnosis not present

## 2022-09-02 DIAGNOSIS — I119 Hypertensive heart disease without heart failure: Secondary | ICD-10-CM | POA: Diagnosis not present

## 2022-10-26 ENCOUNTER — Ambulatory Visit: Payer: BC Managed Care – PPO | Admitting: Podiatry

## 2022-10-26 ENCOUNTER — Encounter: Payer: Self-pay | Admitting: Podiatry

## 2022-10-26 DIAGNOSIS — B351 Tinea unguium: Secondary | ICD-10-CM | POA: Diagnosis not present

## 2022-10-26 DIAGNOSIS — M79676 Pain in unspecified toe(s): Secondary | ICD-10-CM | POA: Diagnosis not present

## 2022-10-26 DIAGNOSIS — E119 Type 2 diabetes mellitus without complications: Secondary | ICD-10-CM | POA: Diagnosis not present

## 2022-10-26 NOTE — Progress Notes (Signed)
Patient ID: Craig Yang, male   DOB: 1966/01/18, 57 y.o.   MRN: [id number] Complaint:  Visit Type: Patient returns to my office for continued preventative foot care services. Complaint: Patient states" my nails have grown long and thick and become painful to walk and wear shoes" Patient has been diagnosed with DM with no foot complications. The patient presents for preventative foot care services. No changes to ROS.    Podiatric Exam: Vascular: dorsalis pedis and posterior tibial pulses are palpable bilateral. Capillary return is immediate. Temperature gradient is WNL. Skin turgor WNL  Sensorium: Normal Semmes Weinstein monofilament test. Normal tactile sensation bilaterally. Nail Exam: Pt has thick disfigured discolored nails with subungual debris noted bilateral entire nail hallux through fifth toenails Ulcer Exam: There is no evidence of ulcer or pre-ulcerative changes or infection. Orthopedic Exam: Muscle tone and strength are WNL. No limitations in general ROM. No crepitus or effusions noted. Foot type and digits show no abnormalities. Bony prominences are unremarkable.  Pes planus. Skin: No Porokeratosis. No infection or ulcers.  Dry scaly skin heels have returned.  Diagnosis:  Onychomycosis, , Pain in right toe, pain in left toes  Treatment & Plan Procedures and Treatment: Consent by patient was obtained for treatment procedures. The patient understood the discussion of treatment and procedures well. All questions were answered thoroughly reviewed. Debridement of mycotic and hypertrophic toenails, 1 through 5 bilateral and clearing of subungual debris. No ulceration, no infection noted.  Return Visit-Office Procedure: Patient instructed to return to the office for a follow up visit 10 weeks  for continued evaluation and treatment.    Gardiner Barefoot DPM

## 2022-11-30 DIAGNOSIS — E1140 Type 2 diabetes mellitus with diabetic neuropathy, unspecified: Secondary | ICD-10-CM | POA: Diagnosis not present

## 2022-11-30 DIAGNOSIS — I119 Hypertensive heart disease without heart failure: Secondary | ICD-10-CM | POA: Diagnosis not present

## 2022-11-30 DIAGNOSIS — J449 Chronic obstructive pulmonary disease, unspecified: Secondary | ICD-10-CM | POA: Diagnosis not present

## 2022-11-30 DIAGNOSIS — M609 Myositis, unspecified: Secondary | ICD-10-CM | POA: Diagnosis not present

## 2023-01-04 ENCOUNTER — Ambulatory Visit: Payer: BC Managed Care – PPO | Admitting: Podiatry

## 2023-01-19 ENCOUNTER — Ambulatory Visit: Payer: BC Managed Care – PPO | Admitting: Podiatry

## 2023-01-25 ENCOUNTER — Ambulatory Visit: Payer: BC Managed Care – PPO | Admitting: Podiatry

## 2023-02-02 ENCOUNTER — Encounter: Payer: Self-pay | Admitting: Podiatry

## 2023-02-02 ENCOUNTER — Ambulatory Visit: Payer: BC Managed Care – PPO | Admitting: Podiatry

## 2023-02-02 DIAGNOSIS — B351 Tinea unguium: Secondary | ICD-10-CM

## 2023-02-02 DIAGNOSIS — E119 Type 2 diabetes mellitus without complications: Secondary | ICD-10-CM | POA: Diagnosis not present

## 2023-02-02 DIAGNOSIS — M79676 Pain in unspecified toe(s): Secondary | ICD-10-CM | POA: Diagnosis not present

## 2023-02-02 NOTE — Progress Notes (Signed)
Patient ID: Craig Yang, male   DOB: 08/15/1965, 56 y.o.   MRN: [id number] Complaint:  Visit Type: Patient returns to my office for continued preventative foot care services. Complaint: Patient states" my nails have grown long and thick and become painful to walk and wear shoes" Patient has been diagnosed with DM with no foot complications. The patient presents for preventative foot care services. No changes to ROS.    Podiatric Exam: Vascular: dorsalis pedis and posterior tibial pulses are palpable bilateral. Capillary return is immediate. Temperature gradient is WNL. Skin turgor WNL  Sensorium: Normal Semmes Weinstein monofilament test. Normal tactile sensation bilaterally. Nail Exam: Pt has thick disfigured discolored nails with subungual debris noted bilateral entire nail hallux through fifth toenails Ulcer Exam: There is no evidence of ulcer or pre-ulcerative changes or infection. Orthopedic Exam: Muscle tone and strength are WNL. No limitations in general ROM. No crepitus or effusions noted. Foot type and digits show no abnormalities. Bony prominences are unremarkable.  Pes planus. Skin: No Porokeratosis. No infection or ulcers.  Dry scaly skin heels have returned.  Diagnosis:  Onychomycosis, , Pain in right toe, pain in left toes  Treatment & Plan Procedures and Treatment: Consent by patient was obtained for treatment procedures. The patient understood the discussion of treatment and procedures well. All questions were answered thoroughly reviewed. Debridement of mycotic and hypertrophic toenails, 1 through 5 bilateral and clearing of subungual debris. No ulceration, no infection noted.  Return Visit-Office Procedure: Patient instructed to return to the office for a follow up visit 10 weeks  for continued evaluation and treatment.    Benino Korinek DPM 

## 2023-04-11 ENCOUNTER — Encounter: Payer: Self-pay | Admitting: Podiatry

## 2023-04-11 ENCOUNTER — Ambulatory Visit: Payer: BC Managed Care – PPO | Admitting: Podiatry

## 2023-04-11 DIAGNOSIS — M79676 Pain in unspecified toe(s): Secondary | ICD-10-CM

## 2023-04-11 DIAGNOSIS — E119 Type 2 diabetes mellitus without complications: Secondary | ICD-10-CM | POA: Diagnosis not present

## 2023-04-11 DIAGNOSIS — B351 Tinea unguium: Secondary | ICD-10-CM

## 2023-04-11 NOTE — Progress Notes (Addendum)
Patient ID: Craig Yang, male   DOB: 30-Sep-1965, 57 y.o.   MRN: [id number] Complaint:  Visit Type: Patient returns to my office for continued preventative foot care services. Complaint: Patient states" my nails have grown long and thick and become painful to walk and wear shoes" Patient has been diagnosed with DM with no foot complications. The patient presents for preventative foot care services. No changes to ROS.    Podiatric Exam: Vascular: dorsalis pedis and posterior tibial pulses are palpable bilateral. Capillary return is immediate. Temperature gradient is WNL. Skin turgor WNL  Sensorium: Normal Semmes Weinstein monofilament test. Normal tactile sensation bilaterally. Nail Exam: Pt has thick disfigured discolored nails with subungual debris noted bilateral entire nail hallux through fifth toenails Ulcer Exam: There is no evidence of ulcer or pre-ulcerative changes or infection. Orthopedic Exam: Muscle tone and strength are WNL. No limitations in general ROM. No crepitus or effusions noted. Foot type and digits show no abnormalities. Bony prominences are unremarkable.  Pes planus. Skin: No Porokeratosis. No infection or ulcers.  Dry scaly skin heels have returned.  Diagnosis:  Onychomycosis, , Pain in right toe, pain in left toes  Treatment & Plan Procedures and Treatment: Consent by patient was obtained for treatment procedures. The patient understood the discussion of treatment and procedures well. All questions were answered thoroughly reviewed. Debridement of mycotic and hypertrophic toenails, 1 through 5 bilateral and clearing of subungual debris. No ulceration, no infection noted.  Return Visit-Office Procedure: Patient instructed to return to the office for a follow up visit 10 weeks  for continued evaluation and treatment. Dispense powersteps.    Helane Gunther DPM

## 2023-04-13 ENCOUNTER — Ambulatory Visit: Payer: BC Managed Care – PPO | Admitting: Podiatry

## 2023-07-11 ENCOUNTER — Ambulatory Visit (INDEPENDENT_AMBULATORY_CARE_PROVIDER_SITE_OTHER): Payer: BC Managed Care – PPO | Admitting: Podiatry

## 2023-07-11 ENCOUNTER — Encounter: Payer: Self-pay | Admitting: Podiatry

## 2023-07-11 DIAGNOSIS — E119 Type 2 diabetes mellitus without complications: Secondary | ICD-10-CM

## 2023-07-11 DIAGNOSIS — B351 Tinea unguium: Secondary | ICD-10-CM | POA: Diagnosis not present

## 2023-07-11 DIAGNOSIS — M79676 Pain in unspecified toe(s): Secondary | ICD-10-CM | POA: Diagnosis not present

## 2023-07-11 DIAGNOSIS — L853 Xerosis cutis: Secondary | ICD-10-CM

## 2023-07-11 NOTE — Progress Notes (Signed)
Patient ID: Craig Yang, male   DOB: June 26, 1966, 57 y.o.   MRN: [id number] Complaint:  Visit Type: Patient returns to my office for continued preventative foot care services. Complaint: Patient states" my nails have grown long and thick and become painful to walk and wear shoes" Patient has been diagnosed with DM with no foot complications. The patient presents for preventative foot care services. No changes to ROS.    Podiatric Exam: Vascular: dorsalis pedis and posterior tibial pulses are palpable bilateral. Capillary return is immediate. Temperature gradient is WNL. Skin turgor WNL  Sensorium: Normal Semmes Weinstein monofilament test. Normal tactile sensation bilaterally. Nail Exam: Pt has thick disfigured discolored nails with subungual debris noted bilateral entire nail hallux through fifth toenails Ulcer Exam: There is no evidence of ulcer or pre-ulcerative changes or infection. Orthopedic Exam: Muscle tone and strength are WNL. No limitations in general ROM. No crepitus or effusions noted. Foot type and digits show no abnormalities. Bony prominences are unremarkable.  Pes planus. Skin: No Porokeratosis. No infection or ulcers.  Dry scaly skin heels.  Diagnosis:  Onychomycosis, , Pain in right toe, pain in left toes  Treatment & Plan Procedures and Treatment: Consent by patient was obtained for treatment procedures. The patient understood the discussion of treatment and procedures well. All questions were answered thoroughly reviewed. Debridement of mycotic and hypertrophic toenails, 1 through 5 bilateral and clearing of subungual debris. No ulceration, no infection noted.  Return Visit-Office Procedure: Patient instructed to return to the office for a follow up visit 10 weeks  for continued evaluation and treatment.     Helane Gunther DPM

## 2023-09-19 ENCOUNTER — Ambulatory Visit: Payer: BC Managed Care – PPO | Admitting: Podiatry

## 2023-09-19 ENCOUNTER — Telehealth: Payer: Self-pay | Admitting: Podiatry

## 2023-09-19 NOTE — Telephone Encounter (Signed)
Pt left message on 2/14 at 705pm to cancel appt as he is not able to make it on Monday. He needs appt after 3pm.  Returned call and left message for pt that I cxled todays appt and put him on the schedule for 2/26 at 330 and to call to confirm

## 2023-09-28 ENCOUNTER — Encounter: Payer: Self-pay | Admitting: Podiatry

## 2023-09-28 ENCOUNTER — Ambulatory Visit (INDEPENDENT_AMBULATORY_CARE_PROVIDER_SITE_OTHER): Payer: BC Managed Care – PPO | Admitting: Podiatry

## 2023-09-28 DIAGNOSIS — B351 Tinea unguium: Secondary | ICD-10-CM

## 2023-09-28 DIAGNOSIS — M79676 Pain in unspecified toe(s): Secondary | ICD-10-CM | POA: Diagnosis not present

## 2023-09-28 DIAGNOSIS — E119 Type 2 diabetes mellitus without complications: Secondary | ICD-10-CM | POA: Diagnosis not present

## 2023-09-28 DIAGNOSIS — L853 Xerosis cutis: Secondary | ICD-10-CM

## 2023-09-28 NOTE — Progress Notes (Signed)
 Patient ID: Craig Yang, male   DOB: June 26, 1966, 58 y.o.   MRN: [id number] Complaint:  Visit Type: Patient returns to my office for continued preventative foot care services. Complaint: Patient states" my nails have grown long and thick and become painful to walk and wear shoes" Patient has been diagnosed with DM with no foot complications. The patient presents for preventative foot care services. No changes to ROS.    Podiatric Exam: Vascular: dorsalis pedis and posterior tibial pulses are palpable bilateral. Capillary return is immediate. Temperature gradient is WNL. Skin turgor WNL  Sensorium: Normal Semmes Weinstein monofilament test. Normal tactile sensation bilaterally. Nail Exam: Pt has thick disfigured discolored nails with subungual debris noted bilateral entire nail hallux through fifth toenails Ulcer Exam: There is no evidence of ulcer or pre-ulcerative changes or infection. Orthopedic Exam: Muscle tone and strength are WNL. No limitations in general ROM. No crepitus or effusions noted. Foot type and digits show no abnormalities. Bony prominences are unremarkable.  Pes planus. Skin: No Porokeratosis. No infection or ulcers.  Dry scaly skin heels.  Diagnosis:  Onychomycosis, , Pain in right toe, pain in left toes  Treatment & Plan Procedures and Treatment: Consent by patient was obtained for treatment procedures. The patient understood the discussion of treatment and procedures well. All questions were answered thoroughly reviewed. Debridement of mycotic and hypertrophic toenails, 1 through 5 bilateral and clearing of subungual debris. No ulceration, no infection noted.  Return Visit-Office Procedure: Patient instructed to return to the office for a follow up visit 10 weeks  for continued evaluation and treatment.     Helane Gunther DPM

## 2023-10-13 DIAGNOSIS — E7800 Pure hypercholesterolemia, unspecified: Secondary | ICD-10-CM | POA: Diagnosis not present

## 2023-10-13 DIAGNOSIS — J449 Chronic obstructive pulmonary disease, unspecified: Secondary | ICD-10-CM | POA: Diagnosis not present

## 2023-10-13 DIAGNOSIS — I119 Hypertensive heart disease without heart failure: Secondary | ICD-10-CM | POA: Diagnosis not present

## 2023-10-13 DIAGNOSIS — Z79899 Other long term (current) drug therapy: Secondary | ICD-10-CM | POA: Diagnosis not present

## 2023-10-13 DIAGNOSIS — E119 Type 2 diabetes mellitus without complications: Secondary | ICD-10-CM | POA: Diagnosis not present

## 2023-10-13 DIAGNOSIS — M609 Myositis, unspecified: Secondary | ICD-10-CM | POA: Diagnosis not present

## 2023-10-13 DIAGNOSIS — E1140 Type 2 diabetes mellitus with diabetic neuropathy, unspecified: Secondary | ICD-10-CM | POA: Diagnosis not present

## 2023-12-14 ENCOUNTER — Ambulatory Visit: Payer: BC Managed Care – PPO | Admitting: Podiatry

## 2023-12-28 DIAGNOSIS — E119 Type 2 diabetes mellitus without complications: Secondary | ICD-10-CM | POA: Diagnosis not present

## 2023-12-28 DIAGNOSIS — H40053 Ocular hypertension, bilateral: Secondary | ICD-10-CM | POA: Diagnosis not present

## 2023-12-28 DIAGNOSIS — H2513 Age-related nuclear cataract, bilateral: Secondary | ICD-10-CM | POA: Diagnosis not present

## 2024-07-04 ENCOUNTER — Ambulatory Visit: Admitting: Podiatry

## 2024-07-04 ENCOUNTER — Encounter: Payer: Self-pay | Admitting: Podiatry

## 2024-07-04 DIAGNOSIS — B351 Tinea unguium: Secondary | ICD-10-CM | POA: Diagnosis not present

## 2024-07-04 DIAGNOSIS — E119 Type 2 diabetes mellitus without complications: Secondary | ICD-10-CM

## 2024-07-04 DIAGNOSIS — M79676 Pain in unspecified toe(s): Secondary | ICD-10-CM | POA: Diagnosis not present

## 2024-07-04 DIAGNOSIS — L853 Xerosis cutis: Secondary | ICD-10-CM | POA: Diagnosis not present

## 2024-07-04 NOTE — Progress Notes (Signed)
 Patient ID: Craig Yang, male   DOB: Apr 14, 1966, 58 y.o.   MRN: [id number] Complaint:  Visit Type: Patient returns to my office for continued preventative foot care services. Complaint: Patient states my nails have grown long and thick and become painful to walk and wear shoes Patient has been diagnosed with DM with no foot complications. The patient presents for preventative foot care services. No changes to ROS.    Podiatric Exam: Vascular: dorsalis pedis and posterior tibial pulses are palpable bilateral. Capillary return is immediate. Temperature gradient is WNL. Skin turgor WNL  Sensorium: Normal Semmes Weinstein monofilament test. Normal tactile sensation bilaterally. Nail Exam: Pt has thick disfigured discolored nails with subungual debris noted bilateral entire nail hallux through fifth toenails Ulcer Exam: There is no evidence of ulcer or pre-ulcerative changes or infection. Orthopedic Exam: Muscle tone and strength are WNL. No limitations in general ROM. No crepitus or effusions noted. Foot type and digits show no abnormalities. Bony prominences are unremarkable.  Pes planus. Skin: No Porokeratosis. No infection or ulcers.  Dry scaly skin heels.  Diagnosis:  Onychomycosis, , Pain in right toe, pain in left toes  Treatment & Plan Procedures and Treatment: Consent by patient was obtained for treatment procedures. The patient understood the discussion of treatment and procedures well. All questions were answered thoroughly reviewed. Debridement of mycotic and hypertrophic toenails, 1 through 5 bilateral and clearing of subungual debris. No ulceration, no infection noted.  Return Visit-Office Procedure: Patient instructed to return to the office for a follow up visit 10 weeks  for continued evaluation and treatment.     Cordella Bold DPM

## 2024-10-02 ENCOUNTER — Ambulatory Visit: Admitting: Podiatry
